# Patient Record
Sex: Female | Born: 1975 | Hispanic: Yes | Marital: Single | Smoking: Former smoker
Health system: Southern US, Community
[De-identification: ages and names within clinical notes are randomized; demographics above are authoritative.]

## PROBLEM LIST (undated history)

## (undated) DIAGNOSIS — E669 Obesity, unspecified: Secondary | ICD-10-CM

## (undated) DIAGNOSIS — A159 Respiratory tuberculosis unspecified: Secondary | ICD-10-CM

## (undated) DIAGNOSIS — O039 Complete or unspecified spontaneous abortion without complication: Secondary | ICD-10-CM

## (undated) DIAGNOSIS — IMO0002 Reserved for concepts with insufficient information to code with codable children: Secondary | ICD-10-CM

## (undated) DIAGNOSIS — R87629 Unspecified abnormal cytological findings in specimens from vagina: Secondary | ICD-10-CM

## (undated) DIAGNOSIS — IMO0001 Reserved for inherently not codable concepts without codable children: Secondary | ICD-10-CM

## (undated) DIAGNOSIS — O09529 Supervision of elderly multigravida, unspecified trimester: Secondary | ICD-10-CM

## (undated) DIAGNOSIS — Z8619 Personal history of other infectious and parasitic diseases: Secondary | ICD-10-CM

## (undated) DIAGNOSIS — R87619 Unspecified abnormal cytological findings in specimens from cervix uteri: Secondary | ICD-10-CM

## (undated) HISTORY — PX: COLPOSCOPY: SHX161

## (undated) HISTORY — PX: WISDOM TOOTH EXTRACTION: SHX21

## (undated) HISTORY — PX: DILATION AND CURETTAGE OF UTERUS: SHX78

## (undated) HISTORY — DX: Respiratory tuberculosis unspecified: A15.9

## (undated) HISTORY — DX: Obesity, unspecified: E66.9

## (undated) HISTORY — DX: Supervision of elderly multigravida, unspecified trimester: O09.529

## (undated) HISTORY — DX: Unspecified abnormal cytological findings in specimens from vagina: R87.629

## (undated) HISTORY — DX: Personal history of other infectious and parasitic diseases: Z86.19

---

## 2010-05-25 LAB — CBC
HCT: 36 % (ref 36–46)
Hemoglobin: 12 g/dL (ref 12.0–16.0)
Platelets: 247 10*3/uL (ref 150–399)

## 2010-05-25 LAB — GC/CHLAMYDIA PROBE AMP, GENITAL: Chlamydia: NEGATIVE

## 2010-05-25 LAB — HIV ANTIBODY (ROUTINE TESTING W REFLEX): HIV: NONREACTIVE

## 2010-12-18 ENCOUNTER — Inpatient Hospital Stay (HOSPITAL_COMMUNITY)
Admission: AD | Admit: 2010-12-18 | Discharge: 2010-12-18 | Disposition: A | Discharge status: Home / Self Care | Payer: Medicaid Other | Source: Ambulatory Visit | Attending: Obstetrics & Gynecology | Admitting: Obstetrics & Gynecology

## 2010-12-18 ENCOUNTER — Encounter (HOSPITAL_COMMUNITY): Payer: Self-pay | Admitting: *Deleted

## 2010-12-18 ENCOUNTER — Inpatient Hospital Stay (HOSPITAL_COMMUNITY): Payer: Medicaid Other

## 2010-12-18 DIAGNOSIS — O0930 Supervision of pregnancy with insufficient antenatal care, unspecified trimester: Secondary | ICD-10-CM | POA: Insufficient documentation

## 2010-12-18 DIAGNOSIS — N760 Acute vaginitis: Secondary | ICD-10-CM

## 2010-12-18 DIAGNOSIS — O4690 Antepartum hemorrhage, unspecified, unspecified trimester: Secondary | ICD-10-CM | POA: Insufficient documentation

## 2010-12-18 HISTORY — DX: Reserved for concepts with insufficient information to code with codable children: IMO0002

## 2010-12-18 HISTORY — DX: Unspecified abnormal cytological findings in specimens from cervix uteri: R87.619

## 2010-12-18 LAB — DIFFERENTIAL
Basophils Absolute: 0 10*3/uL (ref 0.0–0.1)
Eosinophils Relative: 1 % (ref 0–5)
Lymphocytes Relative: 22 % (ref 12–46)
Monocytes Absolute: 0.6 10*3/uL (ref 0.1–1.0)
Monocytes Relative: 5 % (ref 3–12)

## 2010-12-18 LAB — WET PREP, GENITAL
Trich, Wet Prep: NONE SEEN
Yeast Wet Prep HPF POC: NONE SEEN

## 2010-12-18 LAB — ABO/RH
ABO/RH(D): O POS
RH Type: POSITIVE

## 2010-12-18 LAB — CBC
HCT: 31.9 % — ABNORMAL LOW (ref 36.0–46.0)
MCV: 84.4 fL (ref 78.0–100.0)
RDW: 14.5 % (ref 11.5–15.5)
WBC: 11.7 10*3/uL — ABNORMAL HIGH (ref 4.0–10.5)

## 2010-12-18 LAB — HIV ANTIBODY (ROUTINE TESTING W REFLEX): HIV: NONREACTIVE

## 2010-12-18 MED ORDER — METRONIDAZOLE 500 MG PO TABS: 500.0000 mg | ORAL_TABLET | Freq: Three times a day (TID) | ORAL | Status: AC

## 2010-12-18 NOTE — Progress Notes (Signed)
Patient is here with c/o of bright small vaginal bleeding. She states that she woke and voided and noticed the bleeding. She reports good fetal movement. She has not had a dr's appt since July because she moved here from [place] and is awaiting her medicaid. She states that in July amniocentesis and ultrasound were normal. She states that she has some mild intermittent cramping.

## 2010-12-18 NOTE — ED Provider Notes (Addendum)
History     No chief complaint on file.  HPI This is a 35 year old G7 P2 042 at 35 weeks 3 days who presents the MAU with scant vaginal bleeding that happened earlier this morning. She woke up at 4:30 went to the bathroom and when she wiped she had scant blood on the tissue paper. She has not had any episodes of this during the pregnancy. She did have a scant blood and tissue paper after using the bathroom in the MAU. She currently denies fevers, chills, nausea, vomiting, abdominal pain, contractions, vision changes, abdominal pain, hemorrhoids. She does admit to thin white vaginal discharge. All of her previous deliveries have been vaginal. She's had no previous complications to her pregnancies. She recently moved down to [place] from [place].   OB History    Grav Para Term Preterm Abortions TAB SAB Ect Mult Living   7 2 2  4 3 1   2       Past Medical History  Diagnosis Date  . Abnormal Pap smear     Past Surgical History  Procedure Date  . Dilation and curettage of uterus     tab x2  . Colposcopy     No family history on file.  History  Substance Use Topics  . Smoking status: Never Smoker   . Smokeless tobacco: Not on file  . Alcohol Use: No    Allergies: No Known Allergies  Prescriptions prior to admission  Medication Sig Dispense Refill  . prenatal vitamin w/FE, FA (PRENATAL 1 + 1) 27-1 MG TABS Take 1 tablet by mouth daily.          Review of Systems  All other systems reviewed and are negative.   Physical Exam   Blood pressure 117/49, pulse 88, temperature 98.5 F (36.9 C), resp. rate 18, height 5\' 2"  (1.575 m), weight 95.766 kg (211 lb 2 oz), SpO2 98.00%.  Physical Exam  Constitutional: She is oriented to person, place, and time. She appears well-developed and well-nourished.  HENT:  Head: Normocephalic and atraumatic.  Eyes: Pupils are equal, round, and reactive to light.  Neck: Normal range of motion. Neck supple.  Cardiovascular: Normal rate,  regular rhythm and normal heart sounds.   Respiratory: Effort normal and breath sounds normal. No respiratory distress. She has no wheezes. She has no rales. She exhibits no tenderness.  GI: Soft. Bowel sounds are normal. She exhibits no distension and no mass. There is no tenderness. There is no rebound and no guarding.       Fundal height is approximately 35 cm.. Fetus feels cephalic presentation.  Genitourinary: Vagina normal.       Postoperative feels adequate for vaginal delivery.  Musculoskeletal: Normal range of motion.  Neurological: She is alert and oriented to person, place, and time.   NST shows a category 1 tracing with a baseline rate of 120. No contractions are seen on tocometry.  MAU Course  Procedures  Assessment and Plan  #27 35 year old female G7 P2 042 at 35 weeks and 3 days #2 insufficient prenatal care #3 vaginal bleeding  Vaginal cultures were obtained. Wet prep and GC/ Chlamydia were sent. We'll obtain prenatal labs. Due to insufficient to care we will also obtain a complete ultrasound specifically to look for the sent to position.  Patient signed out to Dierdre Christy Gentles, CNM.  Candelaria Celeste JEHIEL 12/18/2010, 7:55 AM  Results for orders placed during the hospital encounter of 12/18/10 (from the past 24 hour(s))  WET PREP, GENITAL  Status: Abnormal   Collection Time   12/18/10  7:50 AM      Component Value Range   Yeast, Wet Prep NONE SEEN  NONE SEEN    Trich, Wet Prep NONE SEEN  NONE SEEN    Clue Cells, Wet Prep FEW (*) NONE SEEN    WBC, Wet Prep HPF POC FEW (*) NONE SEEN   CBC     Status: Abnormal   Collection Time   12/18/10  8:10 AM      Component Value Range   WBC 11.7 (*) 4.0 - 10.5 (K/uL)   RBC 3.78 (*) 3.87 - 5.11 (MIL/uL)   Hemoglobin 10.3 (*) 12.0 - 15.0 (g/dL)   HCT 16.1 (*) 09.6 - 46.0 (%)   MCV 84.4  78.0 - 100.0 (fL)   MCH 27.2  26.0 - 34.0 (pg)   MCHC 32.3  30.0 - 36.0 (g/dL)   RDW 04.5  40.9 - 81.1 (%)   Platelets 158  150 - 400 (K/uL)    DIFFERENTIAL     Status: Abnormal   Collection Time   12/18/10  8:10 AM      Component Value Range   Neutrophils Relative 72  43 - 77 (%)   Neutro Abs 8.4 (*) 1.7 - 7.7 (K/uL)   Lymphocytes Relative 22  12 - 46 (%)   Lymphs Abs 2.5  0.7 - 4.0 (K/uL)   Monocytes Relative 5  3 - 12 (%)   Monocytes Absolute 0.6  0.1 - 1.0 (K/uL)   Eosinophils Relative 1  0 - 5 (%)   Eosinophils Absolute 0.1  0.0 - 0.7 (K/uL)   Basophils Relative 0  0 - 1 (%)   Basophils Absolute 0.0  0.0 - 0.1 (K/uL)  TYPE AND SCREEN     Status: Normal   Collection Time   12/18/10  8:10 AM      Component Value Range   ABO/RH(D) O POS     Antibody Screen NEG     Sample Expiration 12/21/2010    ABO/RH     Status: Normal   Collection Time   12/18/10  8:10 AM      Component Value Range   ABO/RH(D) O POS    GLUCOSE, CAPILLARY     Status: Normal   Collection Time   12/18/10 10:34 AM      Component Value Range   Glucose-Capillary 75  70 - 99 (mg/dL)   Addendum: 1. Only has amnio records and no response yet from  ROI records. Will F/U in [facility] clinic in 6 days. 1. ? Glucose intolerance  States she had early glucola in June due to ?GDM with last pregnancy. Unsure of June result but was told to limit carbs. Will come tomorrow for 1 hr glucola if random CBG is normal.  2. Still reports a little blood on tissue with wiping.[place] today all normal with good growth, nl placenta.  Has few clues on WP so will tx for BV and also check urine C&S. Bleeding precautions.    ;

## 2010-12-18 NOTE — Progress Notes (Signed)
PT SAY TONIGHT  WENT TO B-ROOM-  WHEN WIPED SAW BLOOD  -  - BRIGHT RED.  NO MEDICAL PROBLEMS.  HAS PAD ON IN TRIAGE- NOTHING ON IT.    NO SEX TONIGHT.  PT HAS MOVED FROM [place]- TO DELIVER BABY.    WAS GETTING PNC AT [facility] WITH DR  Elonda Husky .Marland Kitchen LAST SEEN  AT CLINIC WAS 09-04-2010- TOLD EVERYTHING OK.    SAYS HAS PAIN IN R LEG - DOWN  BUTTOCKS.  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  DENIES HSV AND MRSA.

## 2010-12-19 ENCOUNTER — Other Ambulatory Visit: Payer: Self-pay

## 2010-12-19 DIAGNOSIS — Z3490 Encounter for supervision of normal pregnancy, unspecified, unspecified trimester: Secondary | ICD-10-CM

## 2010-12-23 NOTE — ED Provider Notes (Signed)
Agree with above note.  Rhonda Jackson 12/23/2010 11:43 AM

## 2011-01-01 ENCOUNTER — Ambulatory Visit (INDEPENDENT_AMBULATORY_CARE_PROVIDER_SITE_OTHER): Payer: Self-pay | Admitting: Family Medicine

## 2011-01-01 ENCOUNTER — Other Ambulatory Visit: Payer: Self-pay | Admitting: Advanced Practice Midwife

## 2011-01-01 VITALS — BP 109/74 | Temp 97.1°F | Wt 216.4 lb

## 2011-01-01 DIAGNOSIS — O09529 Supervision of elderly multigravida, unspecified trimester: Secondary | ICD-10-CM | POA: Insufficient documentation

## 2011-01-01 DIAGNOSIS — E669 Obesity, unspecified: Secondary | ICD-10-CM | POA: Insufficient documentation

## 2011-01-01 DIAGNOSIS — IMO0002 Reserved for concepts with insufficient information to code with codable children: Secondary | ICD-10-CM

## 2011-01-01 DIAGNOSIS — Z3480 Encounter for supervision of other normal pregnancy, unspecified trimester: Secondary | ICD-10-CM

## 2011-01-01 DIAGNOSIS — O0990 Supervision of high risk pregnancy, unspecified, unspecified trimester: Secondary | ICD-10-CM | POA: Insufficient documentation

## 2011-01-01 LAB — POCT URINALYSIS DIP (DEVICE)
Nitrite: NEGATIVE
Urobilinogen, UA: 0.2 mg/dL (ref 0.0–1.0)
pH: 6.5 (ref 5.0–8.0)

## 2011-01-01 NOTE — Progress Notes (Signed)
Good fetal activity.  No contractions, vaginal discharge, vaginal bleeding.  Has some round ligament pain.  GC/chlamydia done.  GBS done.  Prenatal records from [place] reviewed.  20 week [place] normal.  No other concern.  Follow up in 1 week.

## 2011-01-01 NOTE — Progress Notes (Signed)
Edema-fingers. Pain-right side, Pressure-lower pelvic,vaginal.  White vaginal discharge. Pulse- 98.  Needs GBS Pt would like for baby to not have vaccinations Pt declines flu vaccine and TDap vaccine

## 2011-01-01 NOTE — Patient Instructions (Signed)

## 2011-01-02 LAB — GC/CHLAMYDIA PROBE AMP, GENITAL: GC Probe Amp, Genital: NEGATIVE

## 2011-01-04 LAB — STREP B DNA PROBE: GBS: NEGATIVE

## 2011-01-07 ENCOUNTER — Telehealth (HOSPITAL_COMMUNITY): Payer: Self-pay | Admitting: *Deleted

## 2011-01-07 ENCOUNTER — Encounter (HOSPITAL_COMMUNITY): Payer: Self-pay | Admitting: *Deleted

## 2011-01-07 NOTE — Telephone Encounter (Signed)
Preadmission screen  

## 2011-01-08 ENCOUNTER — Ambulatory Visit (INDEPENDENT_AMBULATORY_CARE_PROVIDER_SITE_OTHER): Payer: Self-pay | Admitting: Advanced Practice Midwife

## 2011-01-08 ENCOUNTER — Other Ambulatory Visit: Payer: Self-pay

## 2011-01-08 VITALS — BP 129/76 | HR 81 | Temp 97.9°F | Wt 220.5 lb

## 2011-01-08 DIAGNOSIS — O09529 Supervision of elderly multigravida, unspecified trimester: Secondary | ICD-10-CM

## 2011-01-08 DIAGNOSIS — Z3480 Encounter for supervision of other normal pregnancy, unspecified trimester: Secondary | ICD-10-CM

## 2011-01-08 LAB — COMPREHENSIVE METABOLIC PANEL
ALT: <8 U/L (ref 0–35)
AST: 11 U/L (ref 0–37)
CO2: 24 mEq/L (ref 19–32)
Chloride: 108 mEq/L (ref 96–112)
Sodium: 139 mEq/L (ref 135–145)
Total Bilirubin: 0.2 mg/dL — ABNORMAL LOW (ref 0.3–1.2)
Total Protein: 6.1 g/dL (ref 6.0–8.3)

## 2011-01-08 LAB — CBC
MCH: 26.8 pg (ref 26.0–34.0)
Platelets: 172 10*3/uL (ref 150–400)
RBC: 3.84 MIL/uL — ABNORMAL LOW (ref 3.87–5.11)
RDW: 15.6 % — ABNORMAL HIGH (ref 11.5–15.5)
WBC: 9.9 10*3/uL (ref 4.0–10.5)

## 2011-01-08 NOTE — Patient Instructions (Signed)
Pregnancy - Third Trimester The third trimester of pregnancy (the last 3 months) is a period of the most rapid growth for you and your baby. The baby approaches a length of 20 inches and a weight of 6 to 10 pounds. The baby is adding on fat and getting ready for life outside your body. While inside, babies have periods of sleeping and waking, suck their thumbs, and hiccups. You can often feel small contractions of the uterus. This is false labor. It is also called Braxton-Hicks contractions. This is like a practice for labor. The usual problems in this stage of pregnancy include more difficulty breathing, swelling of the hands and feet from water retention, and having to urinate more often because of the uterus and baby pressing on your bladder.  PRENATAL EXAMS  Blood work may continue to be done during prenatal exams. These tests are done to check on your health and the probable health of your baby. Blood work is used to follow your blood levels (hemoglobin). Anemia (low hemoglobin) is common during pregnancy. Iron and vitamins are given to help prevent this. You may also continue to be checked for diabetes. Some of the past blood tests may be done again.   The size of the uterus is measured during each visit. This makes sure your baby is growing properly according to your pregnancy dates.   Your blood pressure is checked every prenatal visit. This is to make sure you are not getting toxemia.   Your urine is checked every prenatal visit for infection, diabetes and protein.   Your weight is checked at each visit. This is done to make sure gains are happening at the suggested rate and that you and your baby are growing normally.   Sometimes, an ultrasound is performed to confirm the position and the proper growth and development of the baby. This is a test done that bounces harmless sound waves off the baby so your caregiver can more accurately determine due dates.   Discuss the type of pain  medication and anesthesia you will have during your labor and delivery.   Discuss the possibility and anesthesia if a Cesarean Section might be necessary.   Inform your caregiver if there is any mental or physical violence at home.  Sometimes, a specialized non-stress test, contraction stress test and biophysical profile are done to make sure the baby is not having a problem. Checking the amniotic fluid surrounding the baby is called an amniocentesis. The amniotic fluid is removed by sticking a needle into the belly (abdomen). This is sometimes done near the end of pregnancy if an early delivery is required. In this case, it is done to help make sure the baby's lungs are mature enough for the baby to live outside of the womb. If the lungs are not mature and it is unsafe to deliver the baby, an injection of cortisone medication is given to the mother 1 to 2 days before the delivery. This helps the baby's lungs mature and makes it safer to deliver the baby. CHANGES OCCURING IN THE THIRD TRIMESTER OF PREGNANCY Your body goes through many changes during pregnancy. They vary from person to person. Talk to your caregiver about changes you notice and are concerned about.  During the last trimester, you have probably had an increase in your appetite. It is normal to have cravings for certain foods. This varies from person to person and pregnancy to pregnancy.   You may begin to get stretch marks on your hips,   abdomen, and breasts. These are normal changes in the body during pregnancy. There are no exercises or medications to take which prevent this change.   Constipation may be treated with a stool softener or adding bulk to your diet. Drinking lots of fluids, fiber in vegetables, fruits, and whole grains are helpful.   Exercising is also helpful. If you have been very active up until your pregnancy, most of these activities can be continued during your pregnancy. If you have been less active, it is helpful  to start an exercise program such as walking. Consult your caregiver before starting exercise programs.   Avoid all smoking, alcohol, un-prescribed drugs, herbs and "street drugs" during your pregnancy. These chemicals affect the formation and growth of the baby. Avoid chemicals throughout the pregnancy to ensure the delivery of a healthy infant.   Backache, varicose veins and hemorrhoids may develop or get worse.   You will tire more easily in the third trimester, which is normal.   The baby's movements may be stronger and more often.   You may become short of breath easily.   Your belly button may stick out.   A yellow discharge may leak from your breasts called colostrum.   You may have a bloody mucus discharge. This usually occurs a few days to a week before labor begins.  HOME CARE INSTRUCTIONS   Keep your caregiver's appointments. Follow your caregiver's instructions regarding medication use, exercise, and diet.   During pregnancy, you are providing food for you and your baby. Continue to eat regular, well-balanced meals. Choose foods such as meat, fish, milk and other low fat dairy products, vegetables, fruits, and whole-grain breads and cereals. Your caregiver will tell you of the ideal weight gain.   A physical sexual relationship may be continued throughout pregnancy if there are no other problems such as early (premature) leaking of amniotic fluid from the membranes, vaginal bleeding, or belly (abdominal) pain.   Exercise regularly if there are no restrictions. Check with your caregiver if you are unsure of the safety of your exercises. Greater weight gain will occur in the last 2 trimesters of pregnancy. Exercising helps:   Control your weight.   Get you in shape for labor and delivery.   You lose weight after you deliver.   Rest a lot with legs elevated, or as needed for leg cramps or low back pain.   Wear a good support or jogging bra for breast tenderness during  pregnancy. This may help if worn during sleep. Pads or tissues may be used in the bra if you are leaking colostrum.   Do not use hot tubs, steam rooms, or saunas.   Wear your seat belt when driving. This protects you and your baby if you are in an accident.   Avoid raw meat, cat litter boxes and soil used by cats. These carry germs that can cause birth defects in the baby.   It is easier to loose urine during pregnancy. Tightening up and strengthening the pelvic muscles will help with this problem. You can practice stopping your urination while you are going to the bathroom. These are the same muscles you need to strengthen. It is also the muscles you would use if you were trying to stop from passing gas. You can practice tightening these muscles up 10 times a set and repeating this about 3 times per day. Once you know what muscles to tighten up, do not perform these exercises during urination. It is more likely   to cause an infection by backing up the urine.   Ask for help if you have financial, counseling or nutritional needs during pregnancy. Your caregiver will be able to offer counseling for these needs as well as refer you for other special needs.   Make a list of emergency phone numbers and have them available.   Plan on getting help from family or friends when you go home from the hospital.   Make a trial run to the hospital.   Take prenatal classes with the father to understand, practice and ask questions about the labor and delivery.   Prepare the baby's room/nursery.   Do not travel out of the city unless it is absolutely necessary and with the advice of your caregiver.   Wear only low or no heal shoes to have better balance and prevent falling.  MEDICATIONS AND DRUG USE IN PREGNANCY  Take prenatal vitamins as directed. The vitamin should contain 1 milligram of folic acid. Keep all vitamins out of reach of children. Only a couple vitamins or tablets containing iron may be fatal  to a baby or young child when ingested.   Avoid use of all medications, including herbs, over-the-counter medications, not prescribed or suggested by your caregiver. Only take over-the-counter or prescription medicines for pain, discomfort, or fever as directed by your caregiver. Do not use aspirin, ibuprofen (Motrin, Advil, Nuprin) or naproxen (Aleve) unless OK'd by your caregiver.   Let your caregiver also know about herbs you may be using.   Alcohol is related to a number of birth defects. This includes fetal alcohol syndrome. All alcohol, in any form, should be avoided completely. Smoking will cause low birth rate and premature babies.   Street/illegal drugs are very harmful to the baby. They are absolutely forbidden. A baby born to an addicted mother will be addicted at birth. The baby will go through the same withdrawal an adult does.  SEEK MEDICAL CARE IF: You have any concerns or worries during your pregnancy. It is better to call with your questions if you feel they cannot wait, rather than worry about them. DECISIONS ABOUT CIRCUMCISION You may or may not know the sex of your baby. If you know your baby is a boy, it may be time to think about circumcision. Circumcision is the removal of the foreskin of the penis. This is the skin that covers the sensitive end of the penis. There is no proven medical need for this. Often this decision is made on what is popular at the time or based upon religious beliefs and social issues. You can discuss these issues with your caregiver or pediatrician. SEEK IMMEDIATE MEDICAL CARE IF:   An unexplained oral temperature above 102 F (38.9 C) develops, or as your caregiver suggests.   You have leaking of fluid from the vagina (birth canal). If leaking membranes are suspected, take your temperature and tell your caregiver of this when you call.   There is vaginal spotting, bleeding or passing clots. Tell your caregiver of the amount and how many pads are  used.   You develop a bad smelling vaginal discharge with a change in the color from clear to white.   You develop vomiting that lasts more than 24 hours.   You develop chills or fever.   You develop shortness of breath.   You develop burning on urination.   You loose more than 2 pounds of weight or gain more than 2 pounds of weight or as suggested by your   caregiver.   You notice sudden swelling of your face, hands, and feet or legs.   You develop belly (abdominal) pain. Round ligament discomfort is a common non-cancerous (benign) cause of abdominal pain in pregnancy. Your caregiver still must evaluate you.   You develop a severe headache that does not go away.   You develop visual problems, blurred or double vision.   If you have not felt your baby move for more than 1 hour. If you think the baby is not moving as much as usual, eat something with sugar in it and lie down on your left side for an hour. The baby should move at least 4 to 5 times per hour. Call right away if your baby moves less than that.   You fall, are in a car accident or any kind of trauma.   There is mental or physical violence at home.  Document Released: 02/04/2001 Document Revised: 10/23/2010 Document Reviewed: 08/09/2008 [employer] Patient Information 2012 [employer]. Breastfeeding BENEFITS OF BREASTFEEDING For the baby  The first milk (colostrum) helps the baby's digestive system function better.   There are antibodies from the mother in the milk that help the baby fight off infections.   The baby has a lower incidence of asthma, allergies, and SIDS (sudden infant death syndrome).   The nutrients in breast milk are better than formulas for the baby and helps the baby's brain grow better.   Babies who breastfeed have less gas, colic, and constipation.  For the mother  Breastfeeding helps develop a very special bond between mother and baby.   It is more convenient, always available at the  correct temperature and cheaper than formula feeding.   It burns calories in the mother and helps with losing weight that was gained during pregnancy.   It makes the uterus contract back down to normal size faster and slows bleeding following delivery.   Breastfeeding mothers have a lower risk of developing breast cancer.  NURSE FREQUENTLY  A healthy, full-term baby may breastfeed as often as every hour or space his or her feedings to every 3 hours.   How often to nurse will vary from baby to baby. Watch your baby for signs of hunger, not the clock.   Nurse as often as the baby requests, or when you feel the need to reduce the fullness of your breasts.   Awaken the baby if it has been 3 to 4 hours since the last feeding.   Frequent feeding will help the mother make more milk and will prevent problems like sore nipples and engorgement of the breasts.  BABY'S POSITION AT THE BREAST  Whether lying down or sitting, be sure that the baby's tummy is facing your tummy.   Support the breast with 4 fingers underneath the breast and the thumb above. Make sure your fingers are well away from the nipple and baby's mouth.   Stroke the baby's lips and cheek closest to the breast gently with your finger or nipple.   When the baby's mouth is open wide enough, place all of your nipple and as much of the dark area around the nipple as possible into your baby's mouth.   Pull the baby in close so the tip of the nose and the baby's cheeks touch the breast during the feeding.  FEEDINGS  The length of each feeding varies from baby to baby and from feeding to feeding.   The baby must suck about 2 to 3 minutes for your milk   to get to him or her. This is called a "let down." For this reason, allow the baby to feed on each breast as long as he or she wants. Your baby will end the feeding when he or she has received the right balance of nutrients.   To break the suction, put your finger into the corner of the  baby's mouth and slide it between his or her gums before removing your breast from his or her mouth. This will help prevent sore nipples.  REDUCING BREAST ENGORGEMENT  In the first week after your baby is born, you may experience signs of breast engorgement. When breasts are engorged, they feel heavy, warm, full, and may be tender to the touch. You can reduce engorgement if you:   Nurse frequently, every 2 to 3 hours. Mothers who breastfeed early and often have fewer problems with engorgement.   Place light ice packs on your breasts between feedings. This reduces swelling. Wrap the ice packs in a lightweight towel to protect your skin.   Apply moist hot packs to your breast for 5 to 10 minutes before each feeding. This increases circulation and helps the milk flow.   Gently massage your breast before and during the feeding.   Make sure that the baby empties at least one breast at every feeding before switching sides.   Use a breast pump to empty the breasts if your baby is sleepy or not nursing well. You may also want to pump if you are returning to work or or you feel you are getting engorged.   Avoid bottle feeds, pacifiers or supplemental feedings of water or juice in place of breastfeeding.   Be sure the baby is latched on and positioned properly while breastfeeding.   Prevent fatigue, stress, and anemia.   Wear a supportive bra, avoiding underwire styles.   Eat a balanced diet with enough fluids.  If you follow these suggestions, your engorgement should improve in 24 to 48 hours. If you are still experiencing difficulty, call your lactation consultant or caregiver. IS MY BABY GETTING ENOUGH MILK? Sometimes, mothers worry about whether their babies are getting enough milk. You can be assured that your baby is getting enough milk if:  The baby is actively sucking and you hear swallowing.   The baby nurses at least 8 to 12 times in a 24 hour time period. Nurse your baby until he or  she unlatches or falls asleep at the first breast (at least 10 to 20 minutes), then offer the second side.   The baby is wetting 5 to 6 disposable diapers (6 to 8 cloth diapers) in a 24 hour period by 5 to 6 days of age.   The baby is having at least 2 to 3 stools every 24 hours for the first few months. Breast milk is all the food your baby needs. It is not necessary for your baby to have water or formula. In fact, to help your breasts make more milk, it is best not to give your baby supplemental feedings during the early weeks.   The stool should be soft and yellow.   The baby should gain 4 to 7 ounces per week after he is 4 days old.  TAKE CARE OF YOURSELF Take care of your breasts by:  Bathing or showering daily.   Avoiding the use of soaps on your nipples.   Start feedings on your left breast at one feeding and on your right breast at the next feeding.     You will notice an increase in your milk supply 2 to 5 days after delivery. You may feel some discomfort from engorgement, which makes your breasts very firm and often tender. Engorgement "peaks" out within 24 to 48 hours. In the meantime, apply warm moist towels to your breasts for 5 to 10 minutes before feeding. Gentle massage and expression of some milk before feeding will soften your breasts, making it easier for your baby to latch on. Wear a well fitting nursing bra and air dry your nipples for 10 to 15 minutes after each feeding.   Only use cotton bra pads.   Only use pure lanolin on your nipples after nursing. You do not need to wash it off before nursing.  Take care of yourself by:   Eating well-balanced meals and nutritious snacks.   Drinking milk, fruit juice, and water to satisfy your thirst (about 8 glasses a day).   Getting plenty of rest.   Increasing calcium in your diet (1200 mg a day).   Avoiding foods that you notice affect the baby in a bad way.  SEEK MEDICAL CARE IF:   You have any questions or difficulty  with breastfeeding.   You need help.   You have a hard, red, sore area on your breast, accompanied by a fever of 100.5 F (38.1 C) or more.   Your baby is too sleepy to eat well or is having trouble sleeping.   Your baby is wetting less than 6 diapers per day, by 5 days of age.   Your baby's skin or white part of his or her eyes is more yellow than it was in the hospital.   You feel depressed.  Document Released: 02/10/2005 Document Revised: 10/23/2010 Document Reviewed: 09/25/2008 [employer] Patient Information 2012 [employer]. 

## 2011-01-08 NOTE — Progress Notes (Signed)
C/O lightheadedness, seeing spots, having headaches which resolve with tylenol. BP normal today, no protein on UA. Eating lots of fruit and carbs. Recommended eating more frequently, increase protein, increase hydration. CBC and CMP ordered today. Precautions rev'd.

## 2011-01-08 NOTE — Progress Notes (Signed)
Pain and pressure: pelvic  Vaginal D/C: brownish in color, no odor, no itch Flu Vaccine: Declined Tdap: Declined

## 2011-01-15 ENCOUNTER — Ambulatory Visit (INDEPENDENT_AMBULATORY_CARE_PROVIDER_SITE_OTHER): Payer: Self-pay | Admitting: Obstetrics and Gynecology

## 2011-01-15 ENCOUNTER — Other Ambulatory Visit: Payer: Self-pay

## 2011-01-15 ENCOUNTER — Encounter: Payer: Self-pay | Admitting: Obstetrics and Gynecology

## 2011-01-15 DIAGNOSIS — O09529 Supervision of elderly multigravida, unspecified trimester: Secondary | ICD-10-CM

## 2011-01-15 NOTE — Progress Notes (Signed)
Subjective:    Rhonda Jackson is a 35 y.o. [id number] [redacted]w[redacted]d being seen today for her obstetrical visit.  Patient reports no contractions, no cramping and no leaking. Fetal movement: normal. Patient reports since last visit, dizziness and lightheadedness has improved with increase intake of protein. Patient is a vegetarian.   Objective:    BP 124/84   Temp 98.2 F (36.8 C)   Wt 219 lb 11.2 oz (99.655 kg)  Physical Exam  Exam Heart: regular rate and rhythm. No rubs, murmurs, gallops. Lungs: clear to auscultation bilaterally Abdomen: nontender Cervix: 1.5cm, long, soft  FHT:  120 BPM  Uterine Size: 39 cm        Assessment:  Rhonda Jackson is a 35 y.o. [id number] [redacted]w[redacted]d being seen today for her obstetrical visit.  Pregnancy:  [id number]    Plan:    Patient's pregnancy is progressing as expected. Patient is due in 3 days. Feels some pressure but has not had contractions. Patient is to return to clinic in 1 week or sooner if needed.  Patient Active Problem List  Diagnoses   Advanced maternal age in pregnancy   Supervision of other normal pregnancy   Obesity    Follow up in 1 Week.

## 2011-01-15 NOTE — Progress Notes (Signed)
P=97, c/o increased pelvic pressure when walking or moving, c/o not leaking breastmilk like she did with last 2 pregnancy,

## 2011-01-15 NOTE — Patient Instructions (Signed)
Normal Labor and Delivery Your caregiver must first be sure you are in labor. Signs of labor include:  You may pass what is called "the mucus plug" before labor begins. This is a small amount of blood stained mucus.   Regular uterine contractions.   The time between contractions get closer together.   The discomfort and pain gradually gets more intense.   Pains are mostly located in the back.   Pains get worse when walking.   The cervix (the opening of the uterus becomes thinner (begins to efface) and opens up (dilates).  Once you are in labor and admitted into the hospital or care center, your caregiver will do the following:  A complete physical examination.   Check your vital signs (blood pressure, pulse, temperature and the fetal heart rate).   Do a vaginal examination (using a sterile glove and lubricant) to determine:   The position (presentation) of the baby (head [vertex] or buttock first).   The level (station) of the baby's head in the birth canal.   The effacement and dilatation of the cervix.   You may have your pubic hair shaved and be given an enema depending on your caregiver and the circumstance.   An electronic monitor is usually placed on your abdomen. The monitor follows the length and intensity of the contractions, as well as the baby's heart rate.   Usually, your caregiver will insert an IV in your arm with a bottle of sugar water. This is done as a precaution so that medications can be given to you quickly during labor or delivery.  NORMAL LABOR AND DELIVERY IS DIVIDED UP INTO 3 STAGES: First Stage This is when regular contractions begin and the cervix begins to efface and dilate. This stage can last from 3 to 15 hours. The end of the first stage is when the cervix is 100% effaced and 10 centimeters dilated. Pain medications may be given by   Injection (morphine, demerol, etc.)   Regional anesthesia (spinal, caudal or epidural, anesthetics given in  different locations of the spine). Paracervical pain medication may be given, which is an injection of and anesthetic on each side of the cervix.  A pregnant woman may request to have "Natural Childbirth" which is not to have any medications or anesthesia during her labor and delivery. Second Stage This is when the baby comes down through the birth canal (vagina) and is born. This can take 1 to 4 hours. As the baby's head comes down through the birth canal, you may feel like you are going to have a bowel movement. You will get the urge to bear down and push until the baby is delivered. As the baby's head is being delivered, the caregiver will decide if an episiotomy (a cut in the perineum and vagina area) is needed to prevent tearing of the tissue in this area. The episiotomy is sewn up after the delivery of the baby and placenta. Sometimes a mask with nitrous oxide is given for the mother to breath during the delivery of the baby to help if there is too much pain. The end of Stage 2 is when the baby is fully delivered. Then when the umbilical cord stops pulsating it is clamped and cut. Third Stage The third stage begins after the baby is completely delivered and ends after the placenta (afterbirth) is delivered. This usually takes 5 to 30 minutes. After the placenta is delivered, a medication is given either by intravenous or injection to help contract   the uterus and prevent bleeding. The third stage is not painful and pain medication is usually not necessary. If an episiotomy was done, it is repaired at this time. After the delivery, the mother is watched and monitored closely for 1 to 2 hours to make sure there is no postpartum bleeding (hemorrhage). If there is a lot of bleeding, medication is given to contract the uterus and stop the bleeding. Document Released: 11/20/2007 Document Revised: 10/23/2010 Document Reviewed: 11/20/2007 [employer] Patient Information 2012 [employer]. Breastfeeding BENEFITS OF BREASTFEEDING For the baby  The first milk (colostrum) helps the baby's digestive system function better.   There are antibodies from the mother in the milk that help the baby fight off infections.   The baby has a lower incidence of asthma, allergies, and SIDS (sudden infant death syndrome).   The nutrients in breast milk are better than formulas for the baby and helps the baby's brain grow better.   Babies who breastfeed have less gas, colic, and constipation.  For the mother  Breastfeeding helps develop a very special bond between mother and baby.   It is more convenient, always available at the correct temperature and cheaper than formula feeding.   It burns calories in the mother and helps with losing weight that was gained during pregnancy.   It makes the uterus contract back down to normal size faster and slows bleeding following delivery.   Breastfeeding mothers have a lower risk of developing breast cancer.  NURSE FREQUENTLY  A healthy, full-term baby may breastfeed as often as every hour or space his or her feedings to every 3 hours.   How often to nurse will vary from baby to baby. Watch your baby for signs of hunger, not the clock.   Nurse as often as the baby requests, or when you feel the need to reduce the fullness of your breasts.   Awaken the baby if it has been 3 to 4 hours since the last feeding.   Frequent feeding will help the mother make more milk and will prevent problems like sore nipples and engorgement of the breasts.  BABY'S POSITION AT THE BREAST  Whether lying down or sitting, be sure that the baby's tummy is facing your tummy.   Support the breast with 4 fingers underneath the breast and the thumb above. Make sure your fingers are well away from the nipple and baby's mouth.   Stroke the baby's lips and cheek closest to the breast gently with your finger or nipple.   When the baby's mouth is open wide enough,  place all of your nipple and as much of the dark area around the nipple as possible into your baby's mouth.   Pull the baby in close so the tip of the nose and the baby's cheeks touch the breast during the feeding.  FEEDINGS  The length of each feeding varies from baby to baby and from feeding to feeding.   The baby must suck about 2 to 3 minutes for your milk to get to him or her. This is called a "let down." For this reason, allow the baby to feed on each breast as long as he or she wants. Your baby will end the feeding when he or she has received the right balance of nutrients.   To break the suction, put your finger into the corner of the baby's mouth and slide it between his or her gums before removing your breast from his or her mouth. This will   help prevent sore nipples.  REDUCING BREAST ENGORGEMENT  In the first week after your baby is born, you may experience signs of breast engorgement. When breasts are engorged, they feel heavy, warm, full, and may be tender to the touch. You can reduce engorgement if you:   Nurse frequently, every 2 to 3 hours. Mothers who breastfeed early and often have fewer problems with engorgement.   Place light ice packs on your breasts between feedings. This reduces swelling. Wrap the ice packs in a lightweight towel to protect your skin.   Apply moist hot packs to your breast for 5 to 10 minutes before each feeding. This increases circulation and helps the milk flow.   Gently massage your breast before and during the feeding.   Make sure that the baby empties at least one breast at every feeding before switching sides.   Use a breast pump to empty the breasts if your baby is sleepy or not nursing well. You may also want to pump if you are returning to work or or you feel you are getting engorged.   Avoid bottle feeds, pacifiers or supplemental feedings of water or juice in place of breastfeeding.   Be sure the baby is latched on and positioned properly  while breastfeeding.   Prevent fatigue, stress, and anemia.   Wear a supportive bra, avoiding underwire styles.   Eat a balanced diet with enough fluids.  If you follow these suggestions, your engorgement should improve in 24 to 48 hours. If you are still experiencing difficulty, call your lactation consultant or caregiver. IS MY BABY GETTING ENOUGH MILK? Sometimes, mothers worry about whether their babies are getting enough milk. You can be assured that your baby is getting enough milk if:  The baby is actively sucking and you hear swallowing.   The baby nurses at least 8 to 12 times in a 24 hour time period. Nurse your baby until he or she unlatches or falls asleep at the first breast (at least 10 to 20 minutes), then offer the second side.   The baby is wetting 5 to 6 disposable diapers (6 to 8 cloth diapers) in a 24 hour period by 5 to 6 days of age.   The baby is having at least 2 to 3 stools every 24 hours for the first few months. Breast milk is all the food your baby needs. It is not necessary for your baby to have water or formula. In fact, to help your breasts make more milk, it is best not to give your baby supplemental feedings during the early weeks.   The stool should be soft and yellow.   The baby should gain 4 to 7 ounces per week after he is 4 days old.  TAKE CARE OF YOURSELF Take care of your breasts by:  Bathing or showering daily.   Avoiding the use of soaps on your nipples.   Start feedings on your left breast at one feeding and on your right breast at the next feeding.   You will notice an increase in your milk supply 2 to 5 days after delivery. You may feel some discomfort from engorgement, which makes your breasts very firm and often tender. Engorgement "peaks" out within 24 to 48 hours. In the meantime, apply warm moist towels to your breasts for 5 to 10 minutes before feeding. Gentle massage and expression of some milk before feeding will soften your breasts,  making it easier for your baby to latch on. Wear a   well fitting nursing bra and air dry your nipples for 10 to 15 minutes after each feeding.   Only use cotton bra pads.   Only use pure lanolin on your nipples after nursing. You do not need to wash it off before nursing.  Take care of yourself by:   Eating well-balanced meals and nutritious snacks.   Drinking milk, fruit juice, and water to satisfy your thirst (about 8 glasses a day).   Getting plenty of rest.   Increasing calcium in your diet (1200 mg a day).   Avoiding foods that you notice affect the baby in a bad way.  SEEK MEDICAL CARE IF:   You have any questions or difficulty with breastfeeding.   You need help.   You have a hard, red, sore area on your breast, accompanied by a fever of 100.5 F (38.1 C) or more.   Your baby is too sleepy to eat well or is having trouble sleeping.   Your baby is wetting less than 6 diapers per day, by 5 days of age.   Your baby's skin or white part of his or her eyes is more yellow than it was in the hospital.   You feel depressed.  Document Released: 02/10/2005 Document Revised: 10/23/2010 Document Reviewed: 09/25/2008 [employer] Patient Information 2012 [employer]. 

## 2011-01-22 ENCOUNTER — Telehealth (HOSPITAL_COMMUNITY): Payer: Self-pay | Admitting: *Deleted

## 2011-01-22 ENCOUNTER — Other Ambulatory Visit: Payer: Self-pay

## 2011-01-22 ENCOUNTER — Encounter (HOSPITAL_COMMUNITY): Payer: Self-pay | Admitting: *Deleted

## 2011-01-22 NOTE — Telephone Encounter (Signed)
Preadmission screen  

## 2011-01-23 ENCOUNTER — Ambulatory Visit (INDEPENDENT_AMBULATORY_CARE_PROVIDER_SITE_OTHER): Payer: Self-pay | Admitting: *Deleted

## 2011-01-23 VITALS — BP 136/66

## 2011-01-23 DIAGNOSIS — O480 Post-term pregnancy: Secondary | ICD-10-CM

## 2011-01-23 NOTE — Progress Notes (Signed)
P= 86   NST/AFI today   Pt would not like to be induced until closer to 42 wks.   IOL scheduled for 02/01/11 @ 0730

## 2011-01-25 ENCOUNTER — Inpatient Hospital Stay (HOSPITAL_COMMUNITY)
Admission: AD | Admit: 2011-01-25 | Discharge: 2011-01-25 | Disposition: A | Discharge status: Home / Self Care | Payer: Medicaid Other | Source: Ambulatory Visit | Attending: Obstetrics and Gynecology | Admitting: Obstetrics and Gynecology

## 2011-01-25 ENCOUNTER — Encounter (HOSPITAL_COMMUNITY): Payer: Self-pay | Admitting: *Deleted

## 2011-01-25 DIAGNOSIS — O209 Hemorrhage in early pregnancy, unspecified: Secondary | ICD-10-CM

## 2011-01-25 DIAGNOSIS — Z3480 Encounter for supervision of other normal pregnancy, unspecified trimester: Secondary | ICD-10-CM

## 2011-01-25 DIAGNOSIS — O4690 Antepartum hemorrhage, unspecified, unspecified trimester: Secondary | ICD-10-CM | POA: Insufficient documentation

## 2011-01-25 NOTE — ED Provider Notes (Signed)
History     Chief Complaint  Patient presents with  . Vaginal Bleeding   HPI  Patient presents with one day history of vaginal bleeding.  First noticed a dark red tinged mucous in her underwear yesterday.  Patient thought it was a mucus plug.  This morning, she noticed bright red blood clots and whenever she urinated, the toilet water turned bright red.  Patient and father came to [facility] for evaluation.  She is having irregular ctx every 4-6 minutes, and mild to moderate pelvic pressure when she stands.   Otherwise, she has no complaints.  Denies fever, chills, sweats, abnormal discharge, ROM.  OB History    Grav Para Term Preterm Abortions TAB SAB Ect Mult Living   7 2 2  4 3  0 1  2      Past Medical History  Diagnosis Date  . Abnormal Pap smear   . Tuberculosis     hx +PPD  . History of chlamydia   . Obese   . AMA (advanced maternal age) multigravida 35+     Past Surgical History  Procedure Date  . Dilation and curettage of uterus     tab x2  . Colposcopy   . Wisdom tooth extraction     Family History  Problem Relation Age of Onset  . Cancer Mother     breast cancer  . Osteoporosis Mother   . Gout Father   . Sleep apnea Father   . Emphysema Father   . Anesthesia problems Neg Hx   . Hypotension Neg Hx   . Malignant hyperthermia Neg Hx   . Pseudochol deficiency Neg Hx     History  Substance Use Topics  . Smoking status: Never Smoker   . Smokeless tobacco: Never Used  . Alcohol Use: No    Allergies: No Known Allergies  Prescriptions prior to admission  Medication Sig Dispense Refill  . acetaminophen (TYLENOL) 500 MG tablet Take 1,000 mg by mouth every 6 (six) hours as needed. Head aches       . prenatal vitamin w/FE, FA (PRENATAL 1 + 1) 27-1 MG TABS Take 1 tablet by mouth daily.          ROS  Per HPI  Physical Exam   Blood pressure 142/70, pulse 120.  Physical Exam  Constitutional: No distress.  GI: Soft. She exhibits no distension. There is no  tenderness. There is no rebound.       Gravid  Genitourinary:       Cervical exam: 1/thick/high, + blood-tinged vaginal discharge/mucus  Musculoskeletal: She exhibits edema.  Skin: Skin is warm. No rash noted. No erythema.    MAU Course  Procedures NST  Assessment and Plan  Patient is not currently in active labor. Labor check revealed cervix 1/thick/-3. Bloody mucus likely secondary to bloody show. Prodromal contractions present, FHR reassuring. Plan to D/C home today. IOL scheduled 02/01/11. Labor precautions and bleeding in pregnancy handout given. Patient agreed/understood plan.  DE LA CRUZ,IVY 01/25/2011, 3:11 PM

## 2011-01-25 NOTE — Progress Notes (Signed)
Pt wants the baby to have no immunizations, Vitamin K injection, or eye drops after birth.

## 2011-01-25 NOTE — Progress Notes (Signed)
Irreg ctx, bleeding started this morning at 0800, clots at that time.

## 2011-01-25 NOTE — ED Provider Notes (Signed)
Reviewed and supervised. Agree with note. Reassuring fetal status

## 2011-01-26 ENCOUNTER — Inpatient Hospital Stay (HOSPITAL_COMMUNITY): Admission: RE | Admit: 2011-01-26 | Payer: Self-pay | Source: Ambulatory Visit

## 2011-01-27 ENCOUNTER — Inpatient Hospital Stay (HOSPITAL_COMMUNITY)
Admission: AD | Admit: 2011-01-27 | Discharge: 2011-01-31 | DRG: 766 | Disposition: A | Discharge status: Home / Self Care | Payer: Medicaid Other | Source: Ambulatory Visit | Attending: Obstetrics & Gynecology | Admitting: Obstetrics & Gynecology

## 2011-01-27 ENCOUNTER — Encounter (HOSPITAL_COMMUNITY): Payer: Self-pay | Admitting: *Deleted

## 2011-01-27 ENCOUNTER — Other Ambulatory Visit: Payer: Self-pay

## 2011-01-27 DIAGNOSIS — E669 Obesity, unspecified: Secondary | ICD-10-CM

## 2011-01-27 DIAGNOSIS — O4690 Antepartum hemorrhage, unspecified, unspecified trimester: Secondary | ICD-10-CM

## 2011-01-27 DIAGNOSIS — O324XX0 Maternal care for high head at term, not applicable or unspecified: Secondary | ICD-10-CM

## 2011-01-27 DIAGNOSIS — O480 Post-term pregnancy: Secondary | ICD-10-CM

## 2011-01-27 DIAGNOSIS — IMO0002 Reserved for concepts with insufficient information to code with codable children: Secondary | ICD-10-CM

## 2011-01-27 DIAGNOSIS — Z3480 Encounter for supervision of other normal pregnancy, unspecified trimester: Secondary | ICD-10-CM

## 2011-01-27 LAB — RPR: RPR Ser Ql: NONREACTIVE

## 2011-01-27 LAB — CBC
HCT: 32.3 % — ABNORMAL LOW (ref 36.0–46.0)
Hemoglobin: 10.4 g/dL — ABNORMAL LOW (ref 12.0–15.0)
MCH: 26.5 pg (ref 26.0–34.0)
MCV: 82.4 fL (ref 78.0–100.0)
RBC: 3.92 MIL/uL (ref 3.87–5.11)

## 2011-01-27 MED ORDER — ACETAMINOPHEN 325 MG PO TABS: 650.0000 mg | ORAL_TABLET | ORAL | Status: DC | PRN

## 2011-01-27 MED ORDER — OXYCODONE-ACETAMINOPHEN 5-325 MG PO TABS: 2.0000 | ORAL_TABLET | ORAL | Status: DC | PRN

## 2011-01-27 MED ORDER — LIDOCAINE HCL (PF) 1 % IJ SOLN
30.0000 mL | INTRAMUSCULAR | Status: DC | PRN
  Filled 2011-01-27: qty 30

## 2011-01-27 MED ORDER — FLEET ENEMA 7-19 GM/118ML RE ENEM: 1.0000 | ENEMA | RECTAL | Status: DC | PRN

## 2011-01-27 MED ORDER — TERBUTALINE SULFATE 1 MG/ML IJ SOLN: 0.2500 mg | Freq: Once | INTRAMUSCULAR | Status: AC | PRN

## 2011-01-27 MED ORDER — OXYTOCIN 20 UNITS IN LACTATED RINGERS INFUSION - SIMPLE
1.0000 m[IU]/min | INTRAVENOUS | Status: DC
  Administered 2011-01-27: 2 m[IU]/min via INTRAVENOUS
  Filled 2011-01-27: qty 1000

## 2011-01-27 MED ORDER — LACTATED RINGERS IV SOLN
INTRAVENOUS | Status: DC
  Administered 2011-01-27: 125 mL/h via INTRAVENOUS
  Administered 2011-01-27 – 2011-01-28 (×3): via INTRAVENOUS

## 2011-01-27 MED ORDER — OXYTOCIN BOLUS FROM INFUSION
500.0000 mL | Freq: Once | INTRAVENOUS | Status: DC
  Filled 2011-01-27: qty 500

## 2011-01-27 MED ORDER — NALBUPHINE SYRINGE 5 MG/0.5 ML
10.0000 mg | INJECTION | INTRAMUSCULAR | Status: DC | PRN
  Administered 2011-01-27: 5 mg via INTRAVENOUS
  Filled 2011-01-27: qty 0.5

## 2011-01-27 MED ORDER — NALBUPHINE SYRINGE 5 MG/0.5 ML
10.0000 mg | INJECTION | INTRAMUSCULAR | Status: DC | PRN
  Administered 2011-01-27 – 2011-01-28 (×3): 10 mg via INTRAVENOUS
  Filled 2011-01-27 (×4): qty 1

## 2011-01-27 MED ORDER — CITRIC ACID-SODIUM CITRATE 334-500 MG/5ML PO SOLN
30.0000 mL | ORAL | Status: DC | PRN
  Administered 2011-01-28: 30 mL via ORAL
  Filled 2011-01-27: qty 15

## 2011-01-27 MED ORDER — ONDANSETRON HCL 4 MG/2ML IJ SOLN: 4.0000 mg | Freq: Four times a day (QID) | INTRAMUSCULAR | Status: DC | PRN

## 2011-01-27 MED ORDER — FENTANYL CITRATE 0.05 MG/ML IJ SOLN: 100.0000 ug | INTRAMUSCULAR | Status: DC | PRN

## 2011-01-27 MED ORDER — LACTATED RINGERS IV SOLN: 500.0000 mL | INTRAVENOUS | Status: DC | PRN

## 2011-01-27 MED ORDER — IBUPROFEN 600 MG PO TABS: 600.0000 mg | ORAL_TABLET | Freq: Four times a day (QID) | ORAL | Status: DC | PRN

## 2011-01-27 MED ORDER — OXYTOCIN 20 UNITS IN LACTATED RINGERS INFUSION - SIMPLE: 125.0000 mL/h | Freq: Once | INTRAVENOUS | Status: DC

## 2011-01-27 NOTE — Progress Notes (Signed)
Subjective: Irregular contractions, but strong.  No complaints or concerns.  Objective: BP 87/50  Pulse 93  Temp(Src) 98.1 F (36.7 C) (Oral)  Resp 20  Ht 5\' 2"  (1.575 m)  Wt 101.152 kg (223 lb)  BMI 40.79 kg/m2      FHT:  FHR: 120-130 bpm, variability: moderate,  accelerations:  Present,  decelerations:  Absent UC:   irregular, every 5-10 minutes SVE:   Dilation: 3 Effacement (%): 70 Station: -2 Exam by:: Enis Slipper, RN  Labs: Lab Results  Component Value Date   WBC 11.5* 01/27/2011   HGB 10.4* 01/27/2011   HCT 32.3* 01/27/2011   MCV 82.4 01/27/2011   PLT 167 01/27/2011    Assessment / Plan: continue titrating pitocin.  Category 1 tracing.  No meds.   Continue present management.  Rhonda Jackson, Rhonda Jackson 01/27/2011, 8:47 AM

## 2011-01-27 NOTE — Progress Notes (Signed)
Rhonda Jackson is a 35 y.o. [id number] at [redacted]w[redacted]d admitted for induction of labor due to Post dates.   Subjective:  Feeling pressure but not vaginally  Objective: BP 116/60  Pulse 92  Temp(Src) 98.1 F (36.7 C) (Oral)  Resp 20  Ht 5\' 2"  (1.575 m)  Wt 101.152 kg (223 lb)  BMI 40.79 kg/m2      FHT:  FHR: 125 bpm, variability: moderate,  accelerations:  Present,  decelerations:  Absent UC:   regular, every 3-4 minutes SVE:   Dilation: 3 Effacement (%): 70 Station: -2 Exam by:: Rhonda Slipper, RN  Labs: Lab Results  Component Value Date   WBC 11.5* 01/27/2011   HGB 10.4* 01/27/2011   HCT 32.3* 01/27/2011   MCV 82.4 01/27/2011   PLT 167 01/27/2011    Assessment / Plan: Induction of labor due to postterm,  progressing well on pitocin  Labor: Progressing on Pitocin Fetal Wellbeing:  Category II Pain Control:  Labor support without medications I/D:  n/a Anticipated MOD:  NSVD  Rhonda Jackson 01/27/2011, 11:57 AM

## 2011-01-27 NOTE — Progress Notes (Signed)
Leni Pankonin is a 35 y.o. [id number] at 41w1admitted for induction of labor due to Post dates.  Subjective: A lot of pain. Doesn't want to do this anymore. Says wants a c-section. Willing to get IV pain meds now. No epidural.    Objective: BP 101/76  Pulse 95  Temp(Src) 98.1 F (36.7 C) (Oral)  Resp 20  Ht 5\' 2"  (1.575 m)  Wt 101.152 kg (223 lb)  BMI 40.79 kg/m2      FHT:  FHR: 145 bpm, variability: moderate,  accelerations:  Present,  decelerations:  Absent UC:   Poor tracing SVE:   Dilation: 5.5 Effacement (%): 80 Station: -2 Exam by:: Enis Slipper, RN  Labs: Lab Results  Component Value Date   WBC 11.5* 01/27/2011   HGB 10.4* 01/27/2011   HCT 32.3* 01/27/2011   MCV 82.4 01/27/2011   PLT 167 01/27/2011    Assessment / Plan: Induction of labor due to postterm,  progressing well on pitocin  Labor: Progressing on Pitocin Fetal Wellbeing:  Category I Pain Control:  Labor support without medications I/D:  n/a Anticipated MOD:  NSVD  HUNTER, STEPHEN 01/27/2011, 6:20 PM

## 2011-01-27 NOTE — Progress Notes (Signed)
Pt reports bleeding " a lot"  And contractions x 3 days, , seen on Saturday and was 1 cm on Sat

## 2011-01-27 NOTE — Progress Notes (Signed)
Subjective: Patient having quite a bit of pain - nubain helpful.  Objective: BP 108/81  Pulse 113  Temp(Src) 98.1 F (36.7 C) (Oral)  Resp 20  Ht 5\' 2"  (1.575 m)  Wt 101.152 kg (223 lb)  BMI 40.79 kg/m2      FHT:  FHR: 130s bpm, variability: moderate,  accelerations:  Present,  decelerations:  Absent UC:   regular, every 3-5 minutes SVE:   Dilation: 5.5 Effacement (%): 80 Station: -2 Exam by:: Enis Slipper, RN  Labs: Lab Results  Component Value Date   WBC 11.5* 01/27/2011   HGB 10.4* 01/27/2011   HCT 32.3* 01/27/2011   MCV 82.4 01/27/2011   PLT 167 01/27/2011    Assessment / Plan: IOL for postdates.  Category 1 tracing.  Not much progression - IUPC placed.  Anticipate vaginal delivery.  Rhonda Jackson 01/27/2011, 6:55 PM

## 2011-01-27 NOTE — Progress Notes (Signed)
Subjective: Patient uncomfortable with contractions.  Patient having more contractions.  Objective: BP 134/77  Pulse 103  Temp(Src) 97.8 F (36.6 C) (Oral)  Resp 20  Ht 5\' 2"  (1.575 m)  Wt 101.152 kg (223 lb)  BMI 40.79 kg/m2      FHT:  FHR: 130 bpm, variability: moderate,  accelerations:  Present,  decelerations:  Absent UC:   regular, every 3-5 minutes SVE:   Dilation: 4 Effacement (%): 80 Station: -1 Exam by:: Enis Slipper, RN  Labs: Lab Results  Component Value Date   WBC 11.5* 01/27/2011   HGB 10.4* 01/27/2011   HCT 32.3* 01/27/2011   MCV 82.4 01/27/2011   PLT 167 01/27/2011    Assessment / Plan: Continue titrating pitocin.  Category 1 tracing.  Expect NSVD.  Rhonda Jackson, Rhonda Jackson 01/27/2011, 1:31 PM

## 2011-01-27 NOTE — H&P (Signed)
Rhonda Jackson is a 35 y.o., [id number] at [redacted]w[redacted]d female presenting in early labor, with complaints of small amount bright red vaginal bleeding since Sat with pea sized clots- was seen in [facility]U and was determined to be bloody show and d/c'd.  Overall decreased fm in last few week- last perceived fm w/in last hour.  Denies LOF.  Has IOL scheduled for 02/01/11.  Maternal Medical History:  Reason for admission: Reason for admission: contractions and vaginal bleeding.  Contractions: Onset was 13-24 hours ago.   Frequency: irregular.   Perceived severity is moderate.    Fetal activity: Perceived fetal activity is decreased.   Last perceived fetal movement was within the past hour.    Prenatal complications: Bleeding (In Oct- "from yeast infection", small amount bright red vag bleeding since Sat 12/1).     OB History    Grav Para Term Preterm Abortions TAB SAB Ect Mult Living   7 2 2  4 3  0 1  2     Past Medical History  Diagnosis Date   Abnormal Pap smear    Tuberculosis     hx +PPD   History of chlamydia    Obese    AMA (advanced maternal age) multigravida 35+    TB was in childhood (~35yo) with no problems since, had negative chest xray when pregnant w/ 12yo son  Past Surgical History  Procedure Date   Dilation and curettage of uterus     tab x2   Colposcopy    Wisdom tooth extraction    Family History: family history includes Cancer in her mother; Emphysema in her father; Gout in her father; Osteoporosis in her mother; and Sleep apnea in her father.  There is no history of Anesthesia problems, and Hypotension, and Malignant hyperthermia, and Pseudochol deficiency, . Social History:  reports that she has never smoked. She has never used smokeless tobacco. She reports that she does not drink alcohol or use illicit drugs.  Review of Systems  Constitutional: Negative.   HENT: Positive for congestion (URI since Friday) and sore throat.   Eyes: Negative.   Respiratory: Positive  for cough and sputum production (thick green phlegm since Friday). Negative for shortness of breath.   Cardiovascular: Negative.   Gastrointestinal: Positive for abdominal pain (UC's).  Genitourinary: Negative.   Musculoskeletal: Negative.   Skin: Negative.   Neurological: Negative.   Endo/Heme/Allergies: Negative.   Psychiatric/Behavioral: Negative.     Dilation: 3 Effacement (%): 50 Station: -3 Exam by:: Booker,CNM Blood pressure 126/66, pulse 98, resp. rate 18, height 5\' 2"  (1.575 m), weight 101.152 kg (223 lb).  Vertex position confirmed by bedside u/s by suzanne shores, cnm  Maternal Exam:  Uterine Assessment: Contraction strength is mild.  Contraction frequency is irregular.   Abdomen: Patient reports no abdominal tenderness. Fetal presentation: vertex  Introitus: Normal vulva. Normal vagina.    Physical Exam  Constitutional: She is oriented to person, place, and time. She appears well-developed and well-nourished.  HENT:  Head: Normocephalic.  Eyes: Pupils are equal, round, and reactive to light.  Neck: Normal range of motion.  Cardiovascular: Normal rate and regular rhythm.   Respiratory: Effort normal and breath sounds normal. She has no wheezes.  GI: Soft.       Gravid  Genitourinary: Vagina normal and uterus normal.  Musculoskeletal: Normal range of motion.  Neurological: She is alert and oriented to person, place, and time. She has normal reflexes.  Skin: Skin is warm and dry.  Psychiatric: She has  a normal mood and affect. Her behavior is normal. Judgment and thought content normal.    Prenatal labs: ABO, Rh: --/--/O POS, O POS (10/24 0810) Antibody: NEG (10/24 0810) Rubella: 18.8 (10/24 0810) RPR: NON REACTIVE (10/24 0810)  HBsAg: NEGATIVE (10/24 0810)  HIV: NON REACTIVE (10/24 0810)  GBS: Negative (11/10 0000)   Assessment/Plan: A: Postdates IUP @ 41.1wks      Early labor     Vaginal bleeding of unknown etiology     Cat I FHR     Stable  maternal/fetal unit  P: Admit to L&D     Pitocin augmentation     Desires natural labor/birth     Expect NSVD     H/O TB discussed w/ L&D charge, infection control called- no need for isolation      Plan of care reviewed with Maylon Cos, CNM Joellyn Haff, SNM 01/27/2011, 6:02 AM

## 2011-01-27 NOTE — Progress Notes (Signed)
Rhonda Jackson is a 35 y.o. [id number] at [redacted]w[redacted]d admitted for induction of labor due to Post dates.  Subjective:  Uncomfortable with contractions  Objective: BP 108/52  Pulse 81  Temp(Src) 97.8 F (36.6 C) (Oral)  Resp 18  Ht 5\' 2"  (1.575 m)  Wt 101.152 kg (223 lb)  BMI 40.79 kg/m2      FHT:  FHR: 140 bpm, variability: moderate,  accelerations:  Present,  decelerations:  Present occasional variable.  UC:   regular, every 2-4 minutes SVE:   Dilation: 4 Effacement (%): 80 Station: -1 Exam by:: dr. Adrian Blackwater  Labs: Lab Results  Component Value Date   WBC 11.5* 01/27/2011   HGB 10.4* 01/27/2011   HCT 32.3* 01/27/2011   MCV 82.4 01/27/2011   PLT 167 01/27/2011    Assessment / Plan: Induction of labor due to postterm,  progressing well on pitocin  Labor: AROM to assist with dilation of cervix Fetal Wellbeing:  Category II Pain Control:  Labor support without medications I/D:  n/a Anticipated MOD:  NSVD  Supervised by Candelaria Celeste, DO. Dr. Adrian Blackwater AROM.   Tana Conch 01/27/2011, 3:49 PM

## 2011-01-27 NOTE — Progress Notes (Signed)
Subjective: Patient seen - having quite a bit of pain.  Using nubain.  Patient previously refusing increase in pitocin.  Objective: BP 124/82  Pulse 111  Temp(Src) 98.1 F (36.7 C) (Oral)  Resp 20  Ht 5\' 2"  (1.575 m)  Wt 101.152 kg (223 lb)  BMI 40.79 kg/m2     FHT:  FHR: 130s bpm, variability: moderate,  accelerations:  Present,  decelerations:  Absent UC:   irregular, every 3-5 minutes SVE:   Dilation: 6 Effacement (%): 80 Station: -2 Exam by:: a. white rn  Labs: Lab Results  Component Value Date   WBC 11.5* 01/27/2011   HGB 10.4* 01/27/2011   HCT 32.3* 01/27/2011   MCV 82.4 01/27/2011   PLT 167 01/27/2011    Assessment / Plan: IOL for postdates.  Discussed need to increase pitocin.  Patient agreeable - continue titrating pitocin - @12 .  Category 1 tracing.  Continue with nubain.    STINSON, JACOB JEHIEL 01/27/2011, 8:50 PM

## 2011-01-28 ENCOUNTER — Encounter (HOSPITAL_COMMUNITY): Payer: Self-pay | Admitting: *Deleted

## 2011-01-28 ENCOUNTER — Encounter (HOSPITAL_COMMUNITY): Payer: Self-pay | Admitting: Anesthesiology

## 2011-01-28 ENCOUNTER — Inpatient Hospital Stay (HOSPITAL_COMMUNITY): Payer: Medicaid Other | Admitting: Anesthesiology

## 2011-01-28 ENCOUNTER — Encounter (HOSPITAL_COMMUNITY)
Admission: AD | Disposition: A | Discharge status: Home / Self Care | Payer: Self-pay | Source: Ambulatory Visit | Attending: Obstetrics & Gynecology

## 2011-01-28 SURGERY — Surgical Case
Anesthesia: Regional | Site: Abdomen | Wound class: Clean Contaminated

## 2011-01-28 MED ORDER — ONDANSETRON HCL 4 MG/2ML IJ SOLN
INTRAMUSCULAR | Status: AC
  Filled 2011-01-28: qty 2

## 2011-01-28 MED ORDER — LACTATED RINGERS IV SOLN: INTRAVENOUS | Status: DC

## 2011-01-28 MED ORDER — NALBUPHINE HCL 10 MG/ML IJ SOLN
5.0000 mg | INTRAMUSCULAR | Status: DC | PRN
  Filled 2011-01-28: qty 1

## 2011-01-28 MED ORDER — OXYCODONE-ACETAMINOPHEN 5-325 MG PO TABS
1.0000 | ORAL_TABLET | ORAL | Status: DC | PRN
  Administered 2011-01-29 (×2): 1 via ORAL
  Administered 2011-01-29: 2 via ORAL
  Administered 2011-01-29 (×2): 1 via ORAL
  Administered 2011-01-30 – 2011-01-31 (×8): 2 via ORAL
  Filled 2011-01-28 (×6): qty 2
  Filled 2011-01-28 (×2): qty 1
  Filled 2011-01-28: qty 2
  Filled 2011-01-28: qty 1
  Filled 2011-01-28 (×3): qty 2
  Filled 2011-01-28: qty 1

## 2011-01-28 MED ORDER — ONDANSETRON HCL 4 MG/2ML IJ SOLN: 4.0000 mg | INTRAMUSCULAR | Status: DC | PRN

## 2011-01-28 MED ORDER — KETOROLAC TROMETHAMINE 30 MG/ML IJ SOLN
30.0000 mg | Freq: Four times a day (QID) | INTRAMUSCULAR | Status: AC | PRN
  Administered 2011-01-28: 30 mg via INTRAVENOUS
  Filled 2011-01-28: qty 1

## 2011-01-28 MED ORDER — CEFAZOLIN SODIUM-DEXTROSE 2-3 GM-% IV SOLR
2.0000 g | INTRAVENOUS | Status: DC
  Administered 2011-01-28: 2 g via INTRAVENOUS
  Filled 2011-01-28: qty 50

## 2011-01-28 MED ORDER — PRENATAL PLUS 27-1 MG PO TABS
1.0000 | ORAL_TABLET | Freq: Every day | ORAL | Status: DC
  Administered 2011-01-28 – 2011-01-31 (×3): 1 via ORAL
  Filled 2011-01-28 (×3): qty 1

## 2011-01-28 MED ORDER — MORPHINE SULFATE (PF) 0.5 MG/ML IJ SOLN
INTRAMUSCULAR | Status: DC | PRN
  Administered 2011-01-28: .1 mg via INTRATHECAL

## 2011-01-28 MED ORDER — KETOROLAC TROMETHAMINE 30 MG/ML IJ SOLN: 30.0000 mg | Freq: Four times a day (QID) | INTRAMUSCULAR | Status: AC | PRN

## 2011-01-28 MED ORDER — SODIUM CHLORIDE 0.9 % IV SOLN
1.0000 ug/kg/h | INTRAVENOUS | Status: DC | PRN
  Filled 2011-01-28: qty 2.5

## 2011-01-28 MED ORDER — DIPHENHYDRAMINE HCL 25 MG PO CAPS: 25.0000 mg | ORAL_CAPSULE | Freq: Four times a day (QID) | ORAL | Status: DC | PRN

## 2011-01-28 MED ORDER — IBUPROFEN 600 MG PO TABS
600.0000 mg | ORAL_TABLET | Freq: Four times a day (QID) | ORAL | Status: DC
  Administered 2011-01-29 – 2011-01-31 (×11): 600 mg via ORAL
  Filled 2011-01-28 (×12): qty 1

## 2011-01-28 MED ORDER — PHENYLEPHRINE 40 MCG/ML (10ML) SYRINGE FOR IV PUSH (FOR BLOOD PRESSURE SUPPORT)
PREFILLED_SYRINGE | INTRAVENOUS | Status: AC
  Filled 2011-01-28: qty 5

## 2011-01-28 MED ORDER — KETOROLAC TROMETHAMINE 60 MG/2ML IM SOLN
INTRAMUSCULAR | Status: AC
  Administered 2011-01-28: 60 mg via INTRAMUSCULAR
  Filled 2011-01-28: qty 2

## 2011-01-28 MED ORDER — MEASLES, MUMPS & RUBELLA VAC ~~LOC~~ INJ
0.5000 mL | INJECTION | Freq: Once | SUBCUTANEOUS | Status: DC
  Filled 2011-01-28: qty 0.5

## 2011-01-28 MED ORDER — BUPIVACAINE IN DEXTROSE 0.75-8.25 % IT SOLN
INTRATHECAL | Status: DC | PRN
  Administered 2011-01-28: 1.5 mL via INTRATHECAL

## 2011-01-28 MED ORDER — OXYTOCIN 20 UNITS IN LACTATED RINGERS INFUSION - SIMPLE: 125.0000 mL/h | INTRAVENOUS | Status: AC

## 2011-01-28 MED ORDER — ONDANSETRON HCL 4 MG/2ML IJ SOLN
INTRAMUSCULAR | Status: DC | PRN
  Administered 2011-01-28: 4 mg via INTRAVENOUS

## 2011-01-28 MED ORDER — FENTANYL CITRATE 0.05 MG/ML IJ SOLN: 25.0000 ug | INTRAMUSCULAR | Status: DC | PRN

## 2011-01-28 MED ORDER — LANOLIN HYDROUS EX OINT: 1.0000 "application " | TOPICAL_OINTMENT | CUTANEOUS | Status: DC | PRN

## 2011-01-28 MED ORDER — MENTHOL 3 MG MT LOZG: 1.0000 | LOZENGE | OROMUCOSAL | Status: DC | PRN

## 2011-01-28 MED ORDER — WITCH HAZEL-GLYCERIN EX PADS: 1.0000 "application " | MEDICATED_PAD | CUTANEOUS | Status: DC | PRN

## 2011-01-28 MED ORDER — OXYTOCIN 10 UNIT/ML IJ SOLN
INTRAMUSCULAR | Status: AC
  Filled 2011-01-28: qty 2

## 2011-01-28 MED ORDER — ONDANSETRON HCL 4 MG PO TABS: 4.0000 mg | ORAL_TABLET | ORAL | Status: DC | PRN

## 2011-01-28 MED ORDER — SODIUM CHLORIDE 0.9 % IJ SOLN: 3.0000 mL | INTRAMUSCULAR | Status: DC | PRN

## 2011-01-28 MED ORDER — NALOXONE HCL 0.4 MG/ML IJ SOLN: 0.4000 mg | INTRAMUSCULAR | Status: DC | PRN

## 2011-01-28 MED ORDER — ONDANSETRON HCL 4 MG/2ML IJ SOLN: 4.0000 mg | Freq: Three times a day (TID) | INTRAMUSCULAR | Status: DC | PRN

## 2011-01-28 MED ORDER — OXYTOCIN 20 UNITS IN LACTATED RINGERS INFUSION - SIMPLE
INTRAVENOUS | Status: AC
  Filled 2011-01-28: qty 1000

## 2011-01-28 MED ORDER — FENTANYL CITRATE 0.05 MG/ML IJ SOLN
INTRAMUSCULAR | Status: DC | PRN
  Administered 2011-01-28: 15 ug via INTRATHECAL

## 2011-01-28 MED ORDER — MEPERIDINE HCL 25 MG/ML IJ SOLN: 6.2500 mg | INTRAMUSCULAR | Status: DC | PRN

## 2011-01-28 MED ORDER — DIPHENHYDRAMINE HCL 50 MG/ML IJ SOLN: 12.5000 mg | INTRAMUSCULAR | Status: DC | PRN

## 2011-01-28 MED ORDER — KETOROLAC TROMETHAMINE 60 MG/2ML IM SOLN
60.0000 mg | Freq: Once | INTRAMUSCULAR | Status: AC | PRN
  Administered 2011-01-28: 60 mg via INTRAMUSCULAR

## 2011-01-28 MED ORDER — DIPHENHYDRAMINE HCL 50 MG/ML IJ SOLN: 25.0000 mg | INTRAMUSCULAR | Status: DC | PRN

## 2011-01-28 MED ORDER — DIBUCAINE 1 % RE OINT: 1.0000 "application " | TOPICAL_OINTMENT | RECTAL | Status: DC | PRN

## 2011-01-28 MED ORDER — SIMETHICONE 80 MG PO CHEW
80.0000 mg | CHEWABLE_TABLET | ORAL | Status: DC | PRN
  Administered 2011-01-29: 80 mg via ORAL

## 2011-01-28 MED ORDER — FENTANYL CITRATE 0.05 MG/ML IJ SOLN
INTRAMUSCULAR | Status: AC
  Filled 2011-01-28: qty 2

## 2011-01-28 MED ORDER — SCOPOLAMINE 1 MG/3DAYS TD PT72
1.0000 | MEDICATED_PATCH | Freq: Once | TRANSDERMAL | Status: DC
  Filled 2011-01-28: qty 1

## 2011-01-28 MED ORDER — IBUPROFEN 600 MG PO TABS: 600.0000 mg | ORAL_TABLET | Freq: Four times a day (QID) | ORAL | Status: DC | PRN

## 2011-01-28 MED ORDER — MORPHINE SULFATE 0.5 MG/ML IJ SOLN
INTRAMUSCULAR | Status: AC
  Filled 2011-01-28: qty 10

## 2011-01-28 MED ORDER — DIPHENHYDRAMINE HCL 25 MG PO CAPS: 25.0000 mg | ORAL_CAPSULE | ORAL | Status: DC | PRN

## 2011-01-28 MED ORDER — METOCLOPRAMIDE HCL 5 MG/ML IJ SOLN: 10.0000 mg | Freq: Three times a day (TID) | INTRAMUSCULAR | Status: DC | PRN

## 2011-01-28 MED ORDER — PHENYLEPHRINE HCL 10 MG/ML IJ SOLN
INTRAMUSCULAR | Status: DC | PRN
  Administered 2011-01-28 (×2): 80 ug via INTRAVENOUS
  Administered 2011-01-28: 40 ug via INTRAVENOUS

## 2011-01-28 MED ORDER — TETANUS-DIPHTH-ACELL PERTUSSIS 5-2.5-18.5 LF-MCG/0.5 IM SUSP: 0.5000 mL | Freq: Once | INTRAMUSCULAR | Status: DC

## 2011-01-28 MED ORDER — OXYTOCIN 20 UNITS IN LACTATED RINGERS INFUSION - SIMPLE
INTRAVENOUS | Status: DC | PRN
  Administered 2011-01-28: 20 [IU] via INTRAVENOUS

## 2011-01-28 MED ORDER — MISOPROSTOL 200 MCG PO TABS
ORAL_TABLET | ORAL | Status: AC
  Filled 2011-01-28: qty 5

## 2011-01-28 MED ORDER — LACTATED RINGERS IV SOLN
INTRAVENOUS | Status: DC | PRN
  Administered 2011-01-28 (×3): via INTRAVENOUS

## 2011-01-28 SURGICAL SUPPLY — 30 items
CHLORAPREP W/TINT 26ML (MISCELLANEOUS) IMPLANT
CLOTH BEACON ORANGE TIMEOUT ST (SAFETY) ×2 IMPLANT
CONTAINER PREFILL 10% NBF 15ML (MISCELLANEOUS) IMPLANT
DRAIN JACKSON PRT FLT 7MM (DRAIN) IMPLANT
DRESSING TELFA 8X3 (GAUZE/BANDAGES/DRESSINGS) IMPLANT
DRSG COVADERM 4X8 (GAUZE/BANDAGES/DRESSINGS) ×2 IMPLANT
ELECT REM PT RETURN 9FT ADLT (ELECTROSURGICAL) ×2
ELECTRODE REM PT RTRN 9FT ADLT (ELECTROSURGICAL) ×1 IMPLANT
EVACUATOR SILICONE 100CC (DRAIN) IMPLANT
EXTRACTOR VACUUM M CUP 4 TUBE (SUCTIONS) IMPLANT
GAUZE SPONGE 4X4 12PLY STRL LF (GAUZE/BANDAGES/DRESSINGS) ×4 IMPLANT
GLOVE BIO SURGEON STRL SZ7 (GLOVE) ×6 IMPLANT
GLOVE BIOGEL PI IND STRL 7.0 (GLOVE) ×2 IMPLANT
GLOVE BIOGEL PI INDICATOR 7.0 (GLOVE) ×2
GOWN PREVENTION PLUS LG XLONG (DISPOSABLE) ×6 IMPLANT
KIT ABG SYR 3ML LUER SLIP (SYRINGE) IMPLANT
NEEDLE HYPO 25X5/8 SAFETYGLIDE (NEEDLE) ×2 IMPLANT
NS IRRIG 1000ML POUR BTL (IV SOLUTION) ×2 IMPLANT
PACK C SECTION WH (CUSTOM PROCEDURE TRAY) ×2 IMPLANT
PAD ABD 7.5X8 STRL (GAUZE/BANDAGES/DRESSINGS) IMPLANT
RTRCTR C-SECT PINK 25CM LRG (MISCELLANEOUS) ×2 IMPLANT
SLEEVE SCD COMPRESS KNEE MED (MISCELLANEOUS) IMPLANT
STAPLER VISISTAT 35W (STAPLE) IMPLANT
SUT MON AB 2-0 CT1 36 (SUTURE) ×2 IMPLANT
SUT VIC AB 0 CTX 36 (SUTURE) ×5
SUT VIC AB 0 CTX36XBRD ANBCTRL (SUTURE) ×5 IMPLANT
SUT VIC AB 4-0 KS 27 (SUTURE) IMPLANT
TOWEL OR 17X24 6PK STRL BLUE (TOWEL DISPOSABLE) ×4 IMPLANT
TRAY FOLEY CATH 14FR (SET/KITS/TRAYS/PACK) ×2 IMPLANT
WATER STERILE IRR 1000ML POUR (IV SOLUTION) ×2 IMPLANT

## 2011-01-28 NOTE — Progress Notes (Signed)
Rhonda Jackson is a 35 y.o. [id number] at [redacted]w[redacted]d induction for post dates Subjective:   Objective: BP 115/24  Pulse 89  Temp(Src) 98.4 F (36.9 C) (Oral)  Resp 20  Ht 5\' 2"  (1.575 m)  Wt 101.152 kg (223 lb)  BMI 40.79 kg/m2  SpO2 97%      FHT:  FHR: 130 bpm, variability: moderate,  accelerations:  Present,  decelerations:  Present variables with pushing, resolved not. UC:   regular, every 3 minutes SVE:   Pushes baby to +1, head recedes to 0 when not pushing. Labs: Lab Results  Component Value Date   WBC 11.5* 01/27/2011   HGB 10.4* 01/27/2011   HCT 32.3* 01/27/2011   MCV 82.4 01/27/2011   PLT 167 01/27/2011    Assessment / Plan: Arrest of decent  Labor: pr not pushing anymore.  very tired.  not a candidate for vacuum (0 to +1 station, OP and significant molding) Preeclampsia:  no signs or symptoms of toxicity Fetal Wellbeing:  Category I Pain Control:  Labor support without medications I/D:  n/a Anticipated MOD:  C/s  Patient consented fro cesarean section.  Risks include but not limited to bleeding, infection, damage to intraabdominal organs, hysterectomy, DVT/PE.  Or notified at 5:40 am  Rhonda Neisler H. 01/28/2011, 5:44 AM

## 2011-01-28 NOTE — Consult Note (Signed)
Neonatology Note:   Attendance at C-section:    I was asked to attend this primary C/S at term due to failure of descent. The mother is a G7P2A4 O pos, GBS neg. ROM 15 hours prior to delivery, fluid clear. Infant vigorous with good spontaneous cry and tone. Needed only minimal bulb suctioning. Ap 9/10. Lungs clear to ausc in DR. To CN to care of Pediatrician.   Deatra James, MD

## 2011-01-28 NOTE — Progress Notes (Signed)
OR called and ready for pt in rm 1

## 2011-01-28 NOTE — Progress Notes (Signed)
When md gets to room , pt states that she is no longer feeling pressure with ucs, instructed to call out if she has the sensation to push again

## 2011-01-28 NOTE — Progress Notes (Signed)
  Subjective: Patient standing and rocking with the contractions - does not wish for epidural.  Objective: BP 97/63  Pulse 95  Temp(Src) 98.3 F (36.8 C) (Oral)  Resp 20  Ht 5\' 2"  (1.575 m)  Wt 101.152 kg (223 lb)  BMI 40.79 kg/m2     FHT:  FHR: 140s bpm, variability: moderate,  accelerations:  Present,  decelerations:  Absent UC:   regular, every 2-3 minutes SVE:   Dilation: 7 Effacement (%): 90;80 Station: -1 Exam by:: Hawkins Seaman, DO  Labs: Lab Results  Component Value Date   WBC 11.5* 01/27/2011   HGB 10.4* 01/27/2011   HCT 32.3* 01/27/2011   MCV 82.4 01/27/2011   PLT 167 01/27/2011    Assessment / Plan: Minimal progression.  Category 1 tracing.  Will continue with pitocin.  Anticipate vaginal delivery.  Tyjon Bowen JEHIEL 01/28/2011, 12:11 AM

## 2011-01-28 NOTE — Progress Notes (Signed)
Pt states she does not have urge to push. md notified. Orders to resume pushing in 1 hour

## 2011-01-28 NOTE — Progress Notes (Signed)
Subjective: Patient doing well with nubain.    Objective: BP 126/64  Pulse 115  Temp(Src) 98.2 F (36.8 C) (Oral)  Resp 20  Ht 5\' 2"  (1.575 m)  Wt 101.152 kg (223 lb)  BMI 40.79 kg/m2      FHT:  FHR: 130s bpm, variability: moderate,  accelerations:  Present,  decelerations:  Absent UC:   regular, every 2-4 minutes SVE:   Dilation: 8 Effacement (%): 90;80 Station: -1 Exam by:: Dr. Adrian Blackwater  Labs: Lab Results  Component Value Date   WBC 11.5* 01/27/2011   HGB 10.4* 01/27/2011   HCT 32.3* 01/27/2011   MCV 82.4 01/27/2011   PLT 167 01/27/2011    Assessment / Plan: IOL for postdates.  Good cervical change.  Continue with pitocin and nubain.  anticipate vaginal delivery.  STINSON, JACOB JEHIEL 01/28/2011, 1:39 AM

## 2011-01-28 NOTE — Progress Notes (Signed)
Rhonda Jackson is a 35 y.o. [id number] at 58w2dinduction  Pt feeling pain.   Objective: BP 130/37  Pulse 105  Temp(Src) 99.1 F (37.3 C) (Axillary)  Resp 20  Ht 5\' 2"  (1.575 m)  Wt 101.152 kg (223 lb)  BMI 40.79 kg/m2      FHT:  FHR: 120 bpm, variability: moderate,  accelerations:  Present,  decelerations:  Present --mild variables UC:   regular, every 2 minutes SVE:   Dilation: Lip/rim Effacement (%): 100 Station: -2 Exam by:: Dr. Penne Lash  Labs: Lab Results  Component Value Date   WBC 11.5* 01/27/2011   HGB 10.4* 01/27/2011   HCT 32.3* 01/27/2011   MCV 82.4 01/27/2011   PLT 167 01/27/2011    Assessment / Plan: Made almost 2 cm change in  2 hours.  May begin pushing. Pt will try pushing and see if lip resolves. Baby feels OP.  Christepher Melchior H. 01/28/2011, 3:30 AM

## 2011-01-28 NOTE — Progress Notes (Signed)
Consent signed by pt

## 2011-01-28 NOTE — Progress Notes (Signed)
UR chart review completed.  

## 2011-01-28 NOTE — Transfer of Care (Signed)
Immediate Anesthesia Transfer of Care Note  Patient: Rhonda Jackson  Procedure(s) Performed:  CESAREAN SECTION  Patient Location: PACU  Anesthesia Type: Spinal  Level of Consciousness: awake  Airway & Oxygen Therapy: Patient Spontanous Breathing  Post-op Assessment: Report given to PACU RN  Post vital signs: Reviewed and stable  Complications: No apparent anesthesia complications

## 2011-01-28 NOTE — Op Note (Signed)
Judee Clara PROCEDURE DATE: 01/27/2011 - 01/28/2011  PREOPERATIVE DIAGNOSIS: Intrauterine pregnancy at  [redacted]w[redacted]d weeks gestation; failure to progress: arrest of descent  POSTOPERATIVE DIAGNOSIS: The same  PROCEDURE: Primary Low Transverse Cesarean Section  SURGEON:  Dr. Elsie Lincoln  ASSISTANT: Dr Candelaria Celeste  INDICATIONS: Rhonda Jackson is a 35 y.o. [id number] at [redacted]w[redacted]d taken to the operating room for primary low transverse cesarean section secondary to failure to progress: arrest of descent.  The risks of cesarean section discussed with the patient included but were not limited to: bleeding which may require transfusion or reoperation; infection which may require antibiotics; injury to bowel, bladder, ureters or other surrounding organs; injury to the fetus; need for additional procedures including hysterectomy in the event of a life-threatening hemorrhage; placental abnormalities wth subsequent pregnancies, incisional problems, thromboembolic phenomenon and other postoperative/anesthesia complications. The patient concurred with the proposed plan, giving informed written consent for the procedure.    FINDINGS:  Viable female infant in cephalic presentation.  Apgars 9 and 10, weight, 7 pounds and 9 ounces.  Clear amniotic fluid.  Intact placenta, three vessel cord.  Normal uterus, fallopian tubes and ovaries bilaterally.  ANESTHESIA:    Spinal INTRAVENOUS FLUIDS: 2400 ml ESTIMATED BLOOD LOSS: 600 ml URINE OUTPUT:  225 ml SPECIMENS: Placenta sent to L&D COMPLICATIONS: None immediate  PROCEDURE IN DETAIL:  The patient received intravenous antibiotics and had sequential compression devices applied to her lower extremities while in the preoperative area.  She was then taken to the operating room where spinal anesthesia was administered and was found to be adequate. She was then placed in a dorsal supine position with a leftward tilt, and prepped and draped in a sterile manner.  A foley  catheter was placed into her bladder and attached to constant gravity, which drained clear fluid throughout.  After an adequate timeout was performed, a Pfannenstiel skin incision was made with scalpel and carried through to the underlying layer of fascia. The fascia was incised in the midline and this incision was extended bilaterally using the Mayo scissors. Kocher clamps were applied to the superior aspect of the fascial incision and the underlying rectus muscles were dissected off bluntly. A similar process was carried out on the inferior aspect of the facial incision. The rectus muscles were separated in the midline bluntly and the peritoneum was entered bluntly. An Teacher, early years/pre was inserted.  Attention was turned to the lower uterine segment where a transverse hysterotomy was made with a scalpel and extended bilaterally bluntly.  The infant was successfully delivered, and cord was clamped and cut and infant was handed over to awaiting neonatology team. Uterine massage was then administered and the placenta delivered intact with three-vessel cord. The uterus was then cleared of clot and debris.  The hysterotomy was closed with 0 Vicryl in a running locked fashion, and an imbricating layer was also placed with a 0 Vicryl. Overall, excellent hemostasis was noted. Hemostasis was confirmed on all surfaces.  The fascia was then closed using 0 Vicryl in a running fashion.  The skin was closed with a 4-0 subcuticular stitch in a running fashion.  The patient tolerated the procedure well. Sponge, lap, instrument and needle counts were correct x 2. She was taken to the recovery room in stable condition.    Jerrian Mells JEHIEL DO 01/28/2011 8:08 AM

## 2011-01-28 NOTE — Progress Notes (Signed)
Orders for pt to start pushing; pt refusing at this time

## 2011-01-28 NOTE — Progress Notes (Signed)
Pt called out for md. When md in room pt refused vag exam and does not express needs

## 2011-01-28 NOTE — Anesthesia Postprocedure Evaluation (Signed)
Anesthesia Post Note  Patient: Rhonda Jackson  Procedure(s) Performed:  CESAREAN SECTION  Anesthesia type: Spinal  Patient location: PACU  Post pain: Pain level controlled  Post assessment: Post-op Vital signs reviewed  Last Vitals:  Filed Vitals:   01/28/11 0730  BP: 98/43  Pulse:   Temp:   Resp: 18    Post vital signs: Reviewed  Level of consciousness: awake  Complications: No apparent anesthesia complications

## 2011-01-28 NOTE — Progress Notes (Signed)
This note also relates to the following rows which could not be included: BP - Cannot attach notes to rows marked as read only Pulse Rate - Cannot attach notes to rows marked as read only  Pt educated about c-section; pt educated; verbalized understanding;

## 2011-01-28 NOTE — Addendum Note (Signed)
Addendum  created 01/28/11 1347 by Fanny Dance   Modules edited:Notes Section

## 2011-01-28 NOTE — Anesthesia Procedure Notes (Signed)
Spinal  Patient location during procedure: OR Start time: 01/28/2011 6:21 AM Staffing Performed by: anesthesiologist  Preanesthetic Checklist Completed: patient identified, site marked, surgical consent, pre-op evaluation, timeout performed, IV checked, risks and benefits discussed and monitors and equipment checked Spinal Block Patient position: sitting Prep: site prepped and draped and DuraPrep Patient monitoring: heart rate, cardiac monitor, continuous pulse ox and blood pressure Approach: midline Location: L3-4 Injection technique: single-shot Needle Needle type: Sprotte  Needle gauge: 24 G Needle length: 9 cm Assessment Sensory level: T4 Additional Notes Clear free flow CSF on first attempt.  Patient tolerated procedure well.

## 2011-01-28 NOTE — Progress Notes (Signed)
Pt back at bedside to allow to monitor fhr and uc's

## 2011-01-28 NOTE — Anesthesia Preprocedure Evaluation (Signed)
Anesthesia Evaluation  Patient identified by MRN, date of birth, ID band Patient awake    Reviewed: Allergy & Precautions, H&P , NPO status , Patient's Chart, lab work & pertinent test results, reviewed documented beta blocker date and time   History of Anesthesia Complications Negative for: history of anesthetic complications  Airway Mallampati: II      Dental  (+) Teeth Intact   Pulmonary neg pulmonary ROS,  clear to auscultation  Pulmonary exam normal       Cardiovascular neg cardio ROS regular Normal    Neuro/Psych Negative Neurological ROS  Negative Psych ROS   GI/Hepatic negative GI ROS, Neg liver ROS,   Endo/Other  Morbid obesity  Renal/GU negative Renal ROS  Genitourinary negative   Musculoskeletal   Abdominal   Peds  Hematology negative hematology ROS (+)   Anesthesia Other Findings   Reproductive/Obstetrics (+) Pregnancy (completely dilated, no epidural, pushing x 1.5 hr, now with failure to descend)                           Anesthesia Physical Anesthesia Plan  ASA: III and Emergent  Anesthesia Plan: Spinal   Post-op Pain Management:    Induction:   Airway Management Planned:   Additional Equipment:   Intra-op Plan:   Post-operative Plan:   Informed Consent: I have reviewed the patients History and Physical, chart, labs and discussed the procedure including the risks, benefits and alternatives for the proposed anesthesia with the patient or authorized representative who has indicated his/her understanding and acceptance.   Dental Advisory Given  Plan Discussed with: CRNA and Surgeon  Anesthesia Plan Comments:         Anesthesia Quick Evaluation

## 2011-01-28 NOTE — Progress Notes (Signed)
Pt standing in bathroom; refusing to go closer to monitor to assess  Baby. Will continue to educate and urge pt to allow me to reconnect monitors

## 2011-01-28 NOTE — Anesthesia Postprocedure Evaluation (Signed)
  Anesthesia Post-op Note  Patient: Rhonda Jackson  Procedure(s) Performed:  CESAREAN SECTION  Patient Location: Mother/Baby  Anesthesia Type: Spinal  Level of Consciousness: alert  and oriented  Airway and Oxygen Therapy: Patient Spontanous Breathing  Post-op Pain: mild  Post-op Assessment: Patient's Cardiovascular Status Stable and Respiratory Function Stable  Post-op Vital Signs: stable  Complications: No apparent anesthesia complications

## 2011-01-28 NOTE — Progress Notes (Signed)
Rhonda Jackson is a 35 y.o. [id number] at [redacted]w[redacted]d admitted for labor.   Subjective: Called to bedside by nurse.  Pt reports increased pressure with contractions and the desire to push.  Once in room, pt was too uncomfortable to lay down for exam.  Waited with her through 3 ctx, she states that she was not feeling the desire to push any longer.  Objective: BP 144/113  Pulse 85  Temp(Src) 98.3 F (36.8 C) (Oral)  Resp 20  Ht 5\' 2"  (1.575 m)  Wt 101.152 kg (223 lb)  BMI 40.79 kg/m2      FHT:  FHR: 140 bpm, variability: moderate,  accelerations:  Present,  decelerations:  Absent UC:   regular, every 4-5 minutes SVE:  deferred  Labs: Lab Results  Component Value Date   WBC 11.5* 01/27/2011   HGB 10.4* 01/27/2011   HCT 32.3* 01/27/2011   MCV 82.4 01/27/2011   PLT 167 01/27/2011    Assessment / Plan: Augmentation of labor, progressing well -pt instructed to alert nurse if she is consistently feeling pressure with contractions.   Labor: Progressing normally Fetal Wellbeing:  Category I Pain Control:  nubain Anticipated MOD:  NSVD  O'Grady, Eneida Evers 01/28/2011, 12:41 AM

## 2011-01-29 ENCOUNTER — Other Ambulatory Visit: Payer: Self-pay

## 2011-01-29 ENCOUNTER — Ambulatory Visit (HOSPITAL_COMMUNITY): Admission: RE | Admit: 2011-01-29 | Payer: Self-pay | Source: Ambulatory Visit

## 2011-01-29 ENCOUNTER — Encounter (HOSPITAL_COMMUNITY): Payer: Self-pay | Admitting: Obstetrics & Gynecology

## 2011-01-29 LAB — CBC
MCHC: 32.2 g/dL (ref 30.0–36.0)
Platelets: 153 10*3/uL (ref 150–400)
RDW: 16.3 % — ABNORMAL HIGH (ref 11.5–15.5)

## 2011-01-29 NOTE — H&P (Signed)
I have seen/examined this pt and agree with the above assessment and plan. Rhonda Hoots E.  

## 2011-01-29 NOTE — Progress Notes (Signed)
I have seen and examined patient with Min and I agree with her assessment and plan.

## 2011-01-29 NOTE — Progress Notes (Signed)
Subjective: Postpartum Day 1: Cesarean Delivery Patient reports incisional pain, tolerating PO, + flatus and + BM. Patient concerned about stitches not healing properly, pain with coughing. Taking percocet.  Objective: Vital signs in last 24 hours: Temp:  [97.3 F (36.3 C)-98.7 F (37.1 C)] 98.2 F (36.8 C) (12/05 0609) Pulse Rate:  [65-103] 95  (12/05 0609) Resp:  [10-20] 20  (12/05 0609) BP: (96-121)/(43-71) 121/60 mmHg (12/05 0609) SpO2:  [97 %-100 %] 97 % (12/04 2135)  Physical Exam:  General: alert, cooperative and no distress Lochia: appropriate Uterine Fundus: firm Incision: no significant drainage, no dehiscence, no significant erythema DVT Evaluation: No cords or calf tenderness.   Basename 01/29/11 0523 01/27/11 0611  HGB 8.3* 10.4*  HCT 25.8* 32.3*    Assessment/Plan: Status post Cesarean section. Doing well postoperatively.  Continue current care. Informed patient stitches are safe to move around with and cough. Pain should be managed with medications and encouraged patient to continue with activity. Patient expressed interest in bilateral tubal ligation. Discharge home tomorrow.  Mosetta Putt, PA-S 01/29/2011, 7:28 AM

## 2011-01-30 MED ORDER — FERROUS SULFATE 325 (65 FE) MG PO TABS
325.0000 mg | ORAL_TABLET | Freq: Two times a day (BID) | ORAL | Status: DC
  Administered 2011-01-30 – 2011-01-31 (×3): 325 mg via ORAL
  Filled 2011-01-30 (×3): qty 1

## 2011-01-30 MED ORDER — SENNOSIDES-DOCUSATE SODIUM 8.6-50 MG PO TABS
2.0000 | ORAL_TABLET | Freq: Every day | ORAL | Status: DC
  Administered 2011-01-30: 2 via ORAL

## 2011-01-30 MED ORDER — SIMETHICONE 80 MG PO CHEW
160.0000 mg | CHEWABLE_TABLET | Freq: Four times a day (QID) | ORAL | Status: DC | PRN
  Administered 2011-01-30: 160 mg via ORAL

## 2011-01-30 NOTE — Progress Notes (Signed)
Subjective: Postpartum Day 2: Cesarean Delivery Patient reports incisional pain.    Objective: Vital signs in last 24 hours: Temp:  [98 F (36.7 C)-98.3 F (36.8 C)] 98 F (36.7 C) (12/06 7846) Pulse Rate:  [90-93] 90  (12/06 0637) Resp:  [18] 18  (12/06 0637) BP: (116-125)/(76-78) 116/76 mmHg (12/06 9629)  Physical Exam:  General: alert, cooperative and no distress Lochia: appropriate Uterine Fundus: firm Incision: healing well, no significant drainage DVT Evaluation: No evidence of DVT seen on physical exam.   Basename 01/29/11 0523  HGB 8.3*  HCT 25.8*    Assessment/Plan: Status post Cesarean section. Doing well postoperatively.  Continue current care. Anticipate D/C home tomorrow Breastfeeding Supplement Fe with meals  Rhonda Jackson E. 01/30/2011, 6:43 AM

## 2011-01-31 MED ORDER — OXYCODONE-ACETAMINOPHEN 5-325 MG PO TABS: 1.0000 | ORAL_TABLET | Freq: Four times a day (QID) | ORAL | Status: AC | PRN

## 2011-01-31 MED ORDER — PRENATAL PLUS 27-1 MG PO TABS: 1.0000 | ORAL_TABLET | Freq: Every day | ORAL | Status: DC

## 2011-01-31 MED ORDER — IBUPROFEN 600 MG PO TABS: 600.0000 mg | ORAL_TABLET | Freq: Four times a day (QID) | ORAL | Status: AC

## 2011-01-31 MED ORDER — FERROUS SULFATE 325 (65 FE) MG PO TABS: 325.0000 mg | ORAL_TABLET | Freq: Two times a day (BID) | ORAL | Status: DC

## 2011-01-31 MED ORDER — SENNOSIDES-DOCUSATE SODIUM 8.6-50 MG PO TABS: 2.0000 | ORAL_TABLET | Freq: Every day | ORAL | Status: AC

## 2011-01-31 NOTE — Discharge Summary (Signed)
Obstetric Discharge Summary Reason for Admission: onset of labor Prenatal Procedures: NST Intrapartum Procedures: cesarean: low cervical, transverse Postpartum Procedures: none Complications-Operative and Postpartum: none Hemoglobin  Date Value Range Status  01/29/2011 8.3* 12.0-15.0 (g/dL) Final     DELTA CHECK NOTED     REPEATED TO VERIFY     HCT  Date Value Range Status  01/29/2011 25.8* 36.0-46.0 (%) Final    Discharge Diagnoses: Term Pregnancy-delivered [name] is a 35 y.o. [id number] at [redacted]w[redacted]d who delivered a healthy female by primary low transverse cesarean section secondary to failure to progress: arrest of descent. No complications.   Discharge Information: Date: 01/31/2011 Activity: pelvic rest Diet: routine Medications: PNV, Ibuprofen, Colace, Iron and Percocet Condition: stable Instructions: refer to practice specific booklet Discharge to: home Follow-up Information    Follow up with [facility] in 4 weeks.   Contact information:   [address] [place] [address]          Newborn Data: Live born female  Birth Weight: 7 lb 9.7 oz (3450 g) APGAR: 9, 10  Home with mother.  Rhonda Jackson 01/31/2011, 7:46 AM

## 2011-01-31 NOTE — Progress Notes (Signed)
Subjective: Postpartum Day 3: Cesarean Delivery Patient reports incisional pain, tolerating PO, + flatus, + BM and no problems voiding.    Objective: Vital signs in last 24 hours: Temp:  [97.9 F (36.6 C)-98.3 F (36.8 C)] 97.9 F (36.6 C) (12/07 0540) Pulse Rate:  [85-99] 99  (12/07 0540) Resp:  [18-20] 18  (12/07 0540) BP: (119-128)/(77-84) 119/77 mmHg (12/07 0540)  Physical Exam:  General: alert, cooperative and no distress Lochia: appropriate Uterine Fundus: firm Incision: healing well, no significant drainage, no dehiscence DVT Evaluation: No cords or calf tenderness. Calf/Ankle edema is present 2+ pitting   Basename 01/29/11 0523  HGB 8.3*  HCT 25.8*    Assessment/Plan: Status post Cesarean section. Doing well postoperatively.  Discharge home with standard precautions and return to clinic in 4-6 weeks.  Plans for BTL (medicaid pending), breastfeeding.   HUNTER, STEPHEN 01/31/2011, 7:22 AM

## 2011-02-01 ENCOUNTER — Inpatient Hospital Stay (HOSPITAL_COMMUNITY): Admission: RE | Admit: 2011-02-01 | Payer: Self-pay | Source: Ambulatory Visit

## 2011-02-02 NOTE — Discharge Summary (Signed)
Chart reviewed and agree with management and plan.  

## 2011-02-05 NOTE — Op Note (Signed)
Baby was occiput posterior Agree with above note.  Jyden Kromer H. 02/05/2011 12:44 PM

## 2011-02-27 ENCOUNTER — Ambulatory Visit (INDEPENDENT_AMBULATORY_CARE_PROVIDER_SITE_OTHER): Payer: Self-pay | Admitting: Family

## 2011-02-27 LAB — HEMOGLOBIN: Hemoglobin: 11.1 g/dL — ABNORMAL LOW (ref 12.0–15.0)

## 2011-02-27 MED ORDER — OXYCODONE-ACETAMINOPHEN 5-325 MG PO TABS: 1.0000 | ORAL_TABLET | ORAL | Status: AC | PRN

## 2011-02-27 NOTE — Progress Notes (Signed)
  Subjective:     Rhonda Jackson is a 36 y.o. female who presents for a postpartum visit. She is 4 weeks postpartum following a low cervical transverse Cesarean section. I have fully reviewed the prenatal and intrapartum course. The delivery was at 41 gestational weeks. Outcome: primary cesarean section, low transverse incision. Anesthesia: epidural. Postpartum course has been up until 4 days ago, reports increased incisional pain. Reports increasing activity at home because she felt better.  Baby's course has been uncomplicated. Baby is feeding by breast. Bleeding no bleeding. Bowel function is normal. Bladder function is normal. Patient is not sexually active. Contraception method is none. Postpartum depression screening: negative.   Review of Systems Pertinent items are noted in HPI.   Objective:    BP 132/79  Pulse 88  Temp(Src) 96.8 F (36 C) (Oral)  Ht 5\' 2"  (1.575 m)  Wt 202 lb 14.4 oz (92.035 kg)  BMI 37.11 kg/m2  Breastfeeding? Yes  General:  alert, cooperative and appears stated age   Breasts:  inspection negative, no nipple discharge or bleeding, no masses or nodularity palpable  Lungs: clear to auscultation bilaterally  Heart:  regular rate and rhythm, S1, S2 normal, no murmur, click, rub or gallop  Abdomen: soft, non-tender; bowel sounds normal; no masses,  no organomegaly and incision site healing well, well approximated, slight redness, no abnormal discharge, fungal type odor.     Vulva:  normal  Vagina: normal vagina  Cervix:  no cervical motion tenderness  Corpus: normal size, contour, position, consistency, mobility, non-tender  Adnexa:  normal adnexa  Rectal Exam: Normal rectovaginal exam        Assessment:    Normal postpartum exam.  Incisional Pain  Plan:    1. Contraception: condoms 2. Percocet #20; decrease activity; listen to body 3.  Wash incisional site well; ensure dry; may use topical antifungal; let air dry. 3. Follow up in: 2 weeks or as needed.

## 2011-02-27 NOTE — Progress Notes (Signed)
Addended by: Lynnell Dike on: 02/27/2011 02:25 PM   Modules accepted: Orders

## 2011-02-27 NOTE — Patient Instructions (Signed)
  Place postpartum visit patient instructions here.  

## 2013-01-07 IMAGING — US US OB COMP +14 WK
1 of 2 series · 12 of 28 positions shown · non-contrast
Comparison: none

[Series 1: us ob comp +14 wk · 12 of 50 slices shown]
[im 1/50]
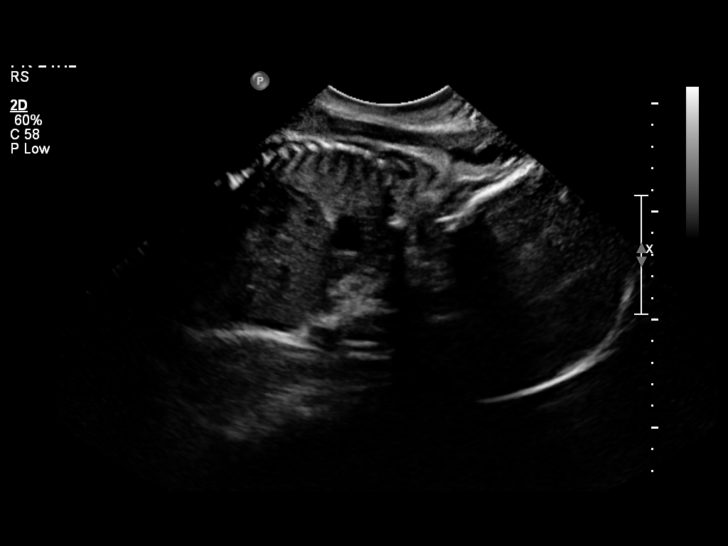
[im 4/50]
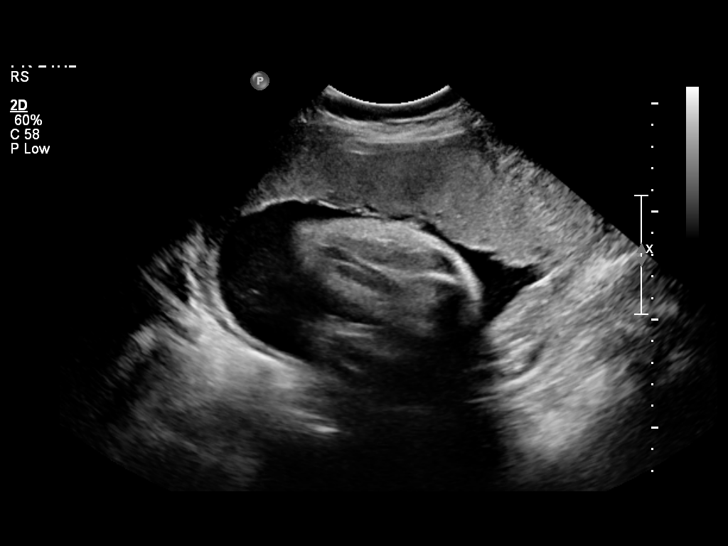
[im 8/50]
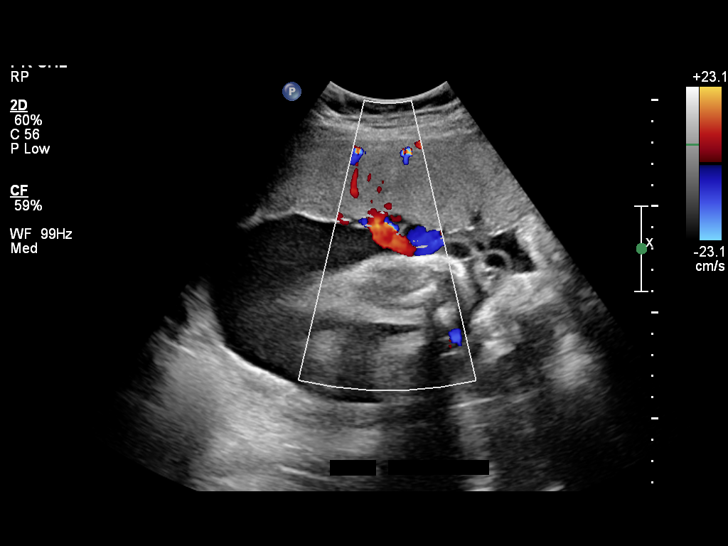
[im 14/50]
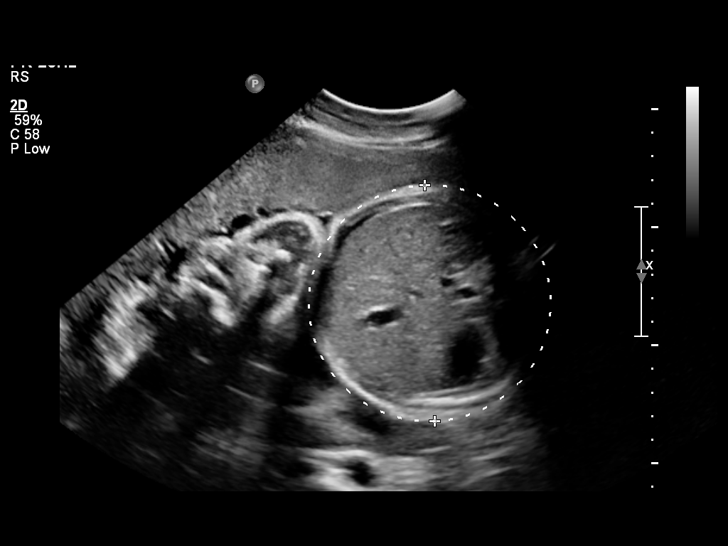
[im 17/50]
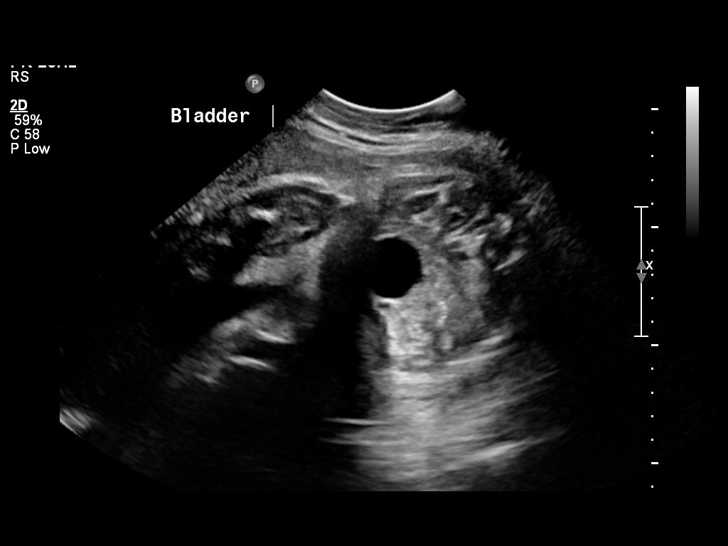
[im 21/50]
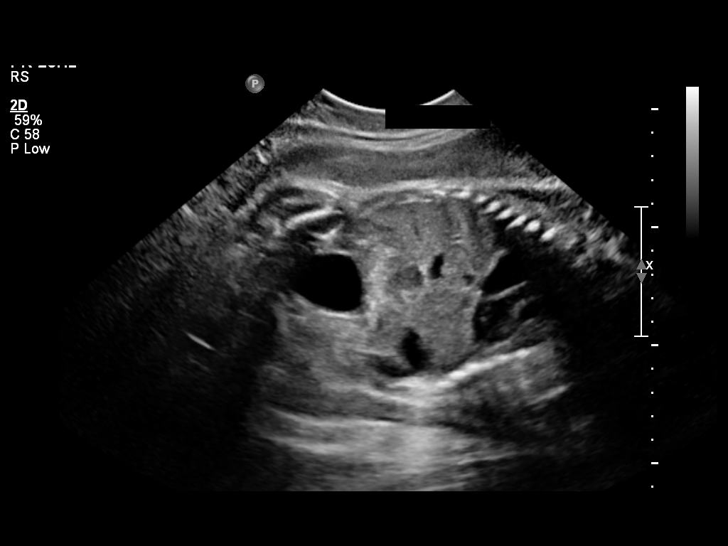
[im 27/50]
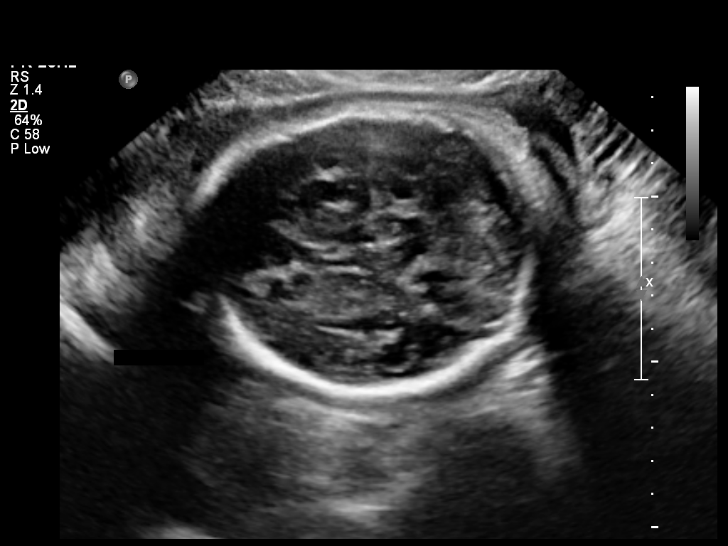
[im 31/50]
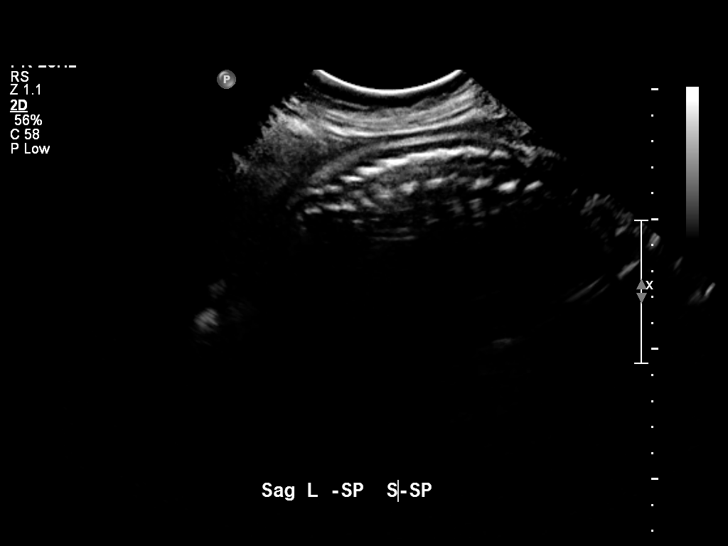
[im 34/50]
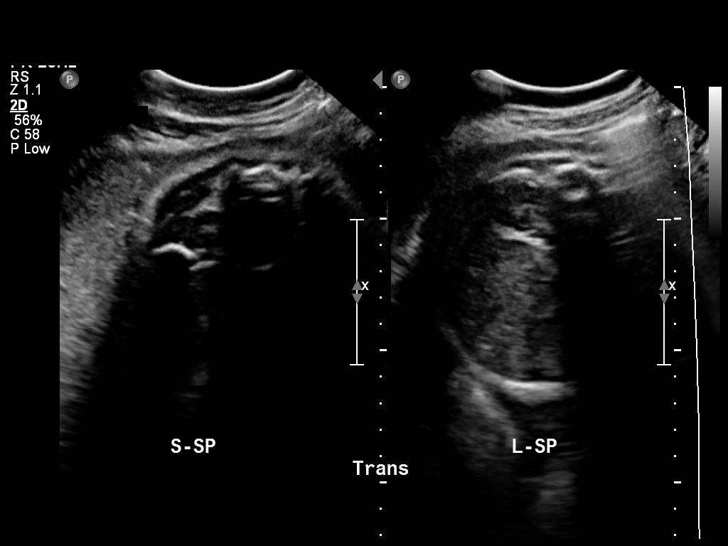
[im 40/50]
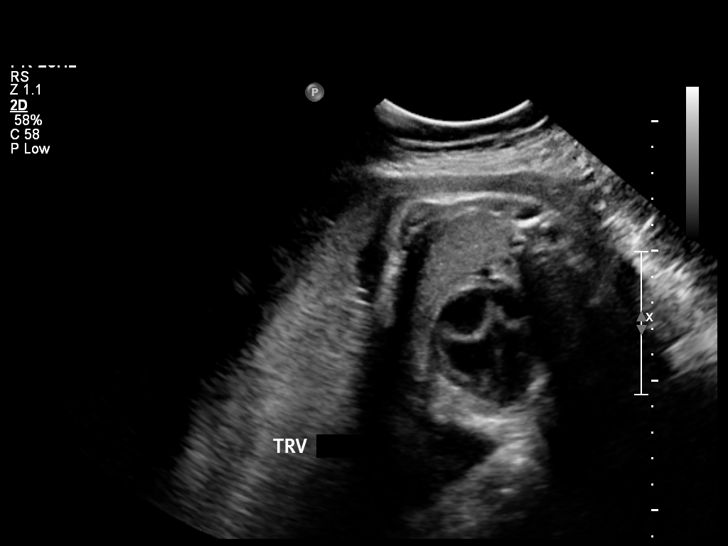
[im 44/50]
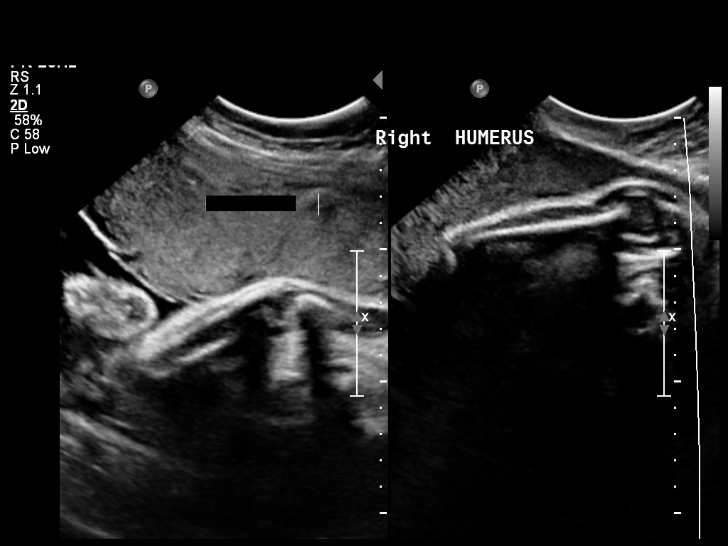
[im 48/50]
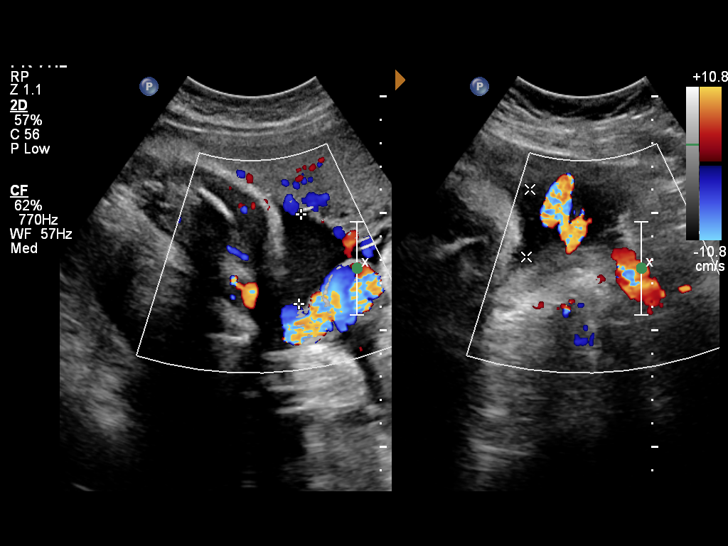

[12 of 28 positions shown; findings below may reference images not displayed]

OBSTETRICS REPORT
                      (Signed Final 12/18/2010 [DATE])

Procedures

 US OB COMP + 14 WK                                    76805.1
Indications

 Routine fetal anatomic survey
 Vaginal bleeding, unknown etiology
 Assess amniotic fluid and placenta
Fetal Evaluation

 Fetal Heart Rate:  127                          bpm
 Cardiac Activity:  Observed
 Presentation:      Cephalic
 Placenta:          Anterior, above cervical os
 P. Cord            Visualized
 Insertion:

 Comment:    No placental abruption or previa identified.

 Amniotic Fluid
 AFI FV:      Subjectively within normal limits
 AFI Sum:     16.06   cm       59  %Tile     Larg Pckt:    6.33  cm
 RUQ:   6.33    cm   RLQ:    3.82   cm    LUQ:   2.88    cm   LLQ:    3.03   cm
Biometry

 BPD:       87  mm     G. Age:  35w 1d                CI:        72.57   70 - 86
                                                      FL/HC:      21.6   20.1 -

 HC:     324.8  mm     G. Age:  36w 5d       51  %    HC/AC:      1.02   0.93 -

 AC:     316.9  mm     G. Age:  35w 4d       63  %    FL/BPD:     80.7   71 - 87
 FL:      70.2  mm     G. Age:  36w 0d       59  %    FL/AC:      22.2   20 - 24

 Est. FW:    1292  gm      6 lb 2 oz     70  %
Gestational Age

 LMP:           35w 3d        Date:  04/14/10                 EDD:   01/19/11
 U/S Today:     35w 6d                                        EDD:   01/16/11
 Best:          35w 3d     Det. By:  LMP  (04/14/10)          EDD:   01/19/11
Anatomy

 Cranium:           Appears normal      Aortic Arch:       Not well
                                                           visualized
 Fetal Cavum:       Appears normal      Ductal Arch:       Not well
                                                           visualized
 Ventricles:        Appears normal      Diaphragm:         Appears normal
 Choroid Plexus:    Appears normal      Stomach:           Appears
                                                           normal, left
                                                           sided
 Cerebellum:        Appears normal      Abdomen:           Appears normal
 Posterior Fossa:   Appears normal      Abdominal Wall:    Appears nml
                                                           (cord insert,
                                                           abd wall)
 Nuchal Fold:       Not applicable      Cord Vessels:      Appears normal
                    (>20 wks GA)                           (3 vessel cord)
 Face:              Lips appear         Kidneys:           Appear normal
                    normal
 Heart:             Appears normal      Bladder:           Appears normal
                    (4 chamber &
                    axis)
 RVOT:              Not well            Spine:             Appears normal
                    visualized
 LVOT:              Not well            Limbs:             Four extremities
                    visualized                             seen

 Other:     Female gender. Technically difficult due to advanced GA
            and fetal position.
Cervix Uterus Adnexa

 Cervix:       Not visualized (advanced GA >34 wks)

 Adnexa:     No abnormality visualized.
Impression

 Single living IUP.  US EGA is concordant with LMP.
 No fetal anomalies seen involving visualized anatomy.
 Amniotic fluid within normal limits, with AFI of 16.06 cm.
 No placental abruption or previa identifed.

 [REDACTED] with us.  Please do not hesitate to contact

## 2013-01-10 ENCOUNTER — Encounter (HOSPITAL_COMMUNITY): Payer: Self-pay | Admitting: Emergency Medicine

## 2013-01-10 ENCOUNTER — Emergency Department (HOSPITAL_COMMUNITY)
Admission: EM | Admit: 2013-01-10 | Discharge: 2013-01-10 | Disposition: A | Discharge status: Home / Self Care | Payer: Medicaid Other | Attending: Emergency Medicine | Admitting: Emergency Medicine

## 2013-01-10 DIAGNOSIS — Z888 Allergy status to other drugs, medicaments and biological substances status: Secondary | ICD-10-CM | POA: Insufficient documentation

## 2013-01-10 DIAGNOSIS — R05 Cough: Secondary | ICD-10-CM | POA: Insufficient documentation

## 2013-01-10 DIAGNOSIS — B9789 Other viral agents as the cause of diseases classified elsewhere: Secondary | ICD-10-CM | POA: Insufficient documentation

## 2013-01-10 DIAGNOSIS — Z8619 Personal history of other infectious and parasitic diseases: Secondary | ICD-10-CM | POA: Insufficient documentation

## 2013-01-10 DIAGNOSIS — J029 Acute pharyngitis, unspecified: Secondary | ICD-10-CM | POA: Insufficient documentation

## 2013-01-10 DIAGNOSIS — Z8742 Personal history of other diseases of the female genital tract: Secondary | ICD-10-CM | POA: Insufficient documentation

## 2013-01-10 DIAGNOSIS — R059 Cough, unspecified: Secondary | ICD-10-CM | POA: Insufficient documentation

## 2013-01-10 DIAGNOSIS — J309 Allergic rhinitis, unspecified: Secondary | ICD-10-CM | POA: Insufficient documentation

## 2013-01-10 DIAGNOSIS — B349 Viral infection, unspecified: Secondary | ICD-10-CM

## 2013-01-10 DIAGNOSIS — Z8611 Personal history of tuberculosis: Secondary | ICD-10-CM | POA: Insufficient documentation

## 2013-01-10 DIAGNOSIS — E669 Obesity, unspecified: Secondary | ICD-10-CM | POA: Insufficient documentation

## 2013-01-10 HISTORY — DX: Reserved for inherently not codable concepts without codable children: IMO0001

## 2013-01-10 HISTORY — DX: Complete or unspecified spontaneous abortion without complication: O03.9

## 2013-01-10 LAB — RAPID STREP SCREEN (MED CTR MEBANE ONLY): Streptococcus, Group A Screen (Direct): NEGATIVE

## 2013-01-10 MED ORDER — DEXAMETHASONE SODIUM PHOSPHATE 10 MG/ML IJ SOLN
10.0000 mg | Freq: Once | INTRAMUSCULAR | Status: AC
  Administered 2013-01-10: 10 mg via INTRAVENOUS
  Filled 2013-01-10: qty 1

## 2013-01-10 MED ORDER — KETOROLAC TROMETHAMINE 60 MG/2ML IM SOLN
60.0000 mg | Freq: Once | INTRAMUSCULAR | Status: AC
  Administered 2013-01-10: 60 mg via INTRAMUSCULAR
  Filled 2013-01-10: qty 2

## 2013-01-10 NOTE — ED Provider Notes (Signed)
CSN: [id number]     Arrival date & time 01/10/13  2053 History   First MD Initiated Contact with Patient 01/10/13 2119     Chief Complaint  Patient presents with  . Fever   (Consider location/radiation/quality/duration/timing/severity/associated sxs/prior Treatment) HPI Here with fever, chills, sore throat, nasal drainage, cough. Onset was this morning, constant, worsening.  The pain is moderate, located to throat. Modifying factors: worse with swallowing.  Associated symptoms: no syncope, no emesis, no dysuria.  Recent medical care: none.   Past Medical History  Diagnosis Date  . Abnormal Pap smear   . Tuberculosis     hx +PPD  . History of chlamydia   . Obese   . AMA (advanced maternal age) multigravida 35+   . Abortion    Past Surgical History  Procedure Laterality Date  . Dilation and curettage of uterus      tab x2  . Colposcopy    . Wisdom tooth extraction    . Cesarean section  01/28/2011    Procedure: CESAREAN SECTION;  Surgeon: [name], MD;  Location: [facility];  Service: Gynecology;  Laterality: N/A;   Family History  Problem Relation Age of Onset  . Cancer Mother     breast cancer  . Osteoporosis Mother   . Gout Father   . Sleep apnea Father   . Emphysema Father   . Anesthesia problems Neg Hx   . Hypotension Neg Hx   . Malignant hyperthermia Neg Hx   . Pseudochol deficiency Neg Hx    History  Substance Use Topics  . Smoking status: Never Smoker   . Smokeless tobacco: Never Used  . Alcohol Use: No   OB History   Grav Para Term Preterm Abortions TAB SAB Ect Mult Living   7 3 3  4 3  0 1  3     Review of Systems Constitutional: Negative for weight loss.  Eyes: Negative for vision loss.  ENT: Negative for difficulty swallowing.  Cardiovascular: Negative for chest pain. Respiratory: Negative for respiratory distress.  Gastrointestinal:  Negative for vomiting.  Genitourinary: Negative for inability to void.  Musculoskeletal: Negative for gait  problem.  Integumentary: Negative for rash.  Neurological: Negative for new focal weakness.     Allergies  Naproxen  Home Medications  No current outpatient prescriptions on file. BP 146/96  Pulse 84  Temp(Src) 97.9 F (36.6 C) (Oral)  Resp 16  Ht 5\' 2"  (1.575 m)  Wt 222 lb 9.6 oz (100.971 kg)  BMI 40.70 kg/m2  SpO2 98%  LMP 01/10/2013 Physical Exam Nursing note and vitals reviewed.  Constitutional: Pt is alert and appears stated age. Eyes: No injection, no scleral icterus. HENT: Atraumatic, airway open without erythema or exudate.  Neck: Mild bilateral lymphadenopathy.  Respiratory: No respiratory distress. Equal breathing bilaterally. Cardiovascular: Normal rate. Extremities warm and well perfused.  Abdomen: Soft, non-tender. MSK: Extremities are atraumatic without deformity. Skin: No rash, no wounds.   Neuro: No motor nor sensory deficit. GCS 15.      ED Course  Procedures (including critical care time) Labs Review Labs Reviewed  RAPID STREP SCREEN  CULTURE, GROUP A STREP   Imaging Review No results found.  EKG Interpretation   None       MDM   1. Viral syndrome    37 y.o. female presents w/ likely viral syndrome. Looks well, non-toxic. No signs of PTA or RPA. Not c/w PNA. Pt denies possibility of pregnancy. Strep screen neg. Pt given toradol,  decadron. Counseling provided regarding diagnosis, treatment plan, follow up recommendations, and return precautions. Questions answered.       I independently viewed, interpreted, and used in my medical decision making all ordered lab and imaging tests. Medical Decision Making discussed with ED attending Enid Skeens, MD      Charm Barges, MD 01/10/13 [phone]

## 2013-01-10 NOTE — ED Notes (Signed)
MD at bedside. 

## 2013-01-10 NOTE — ED Notes (Signed)
Pt. woke up this morning with fever , chills and bilateral neck pain denies injury , airway intact / respirations unlabored .

## 2013-01-11 NOTE — ED Provider Notes (Signed)
Medical screening examination/treatment/procedure(s) were conducted as a shared visit with non-physician practitioner(s) or resident  and myself.  I personally evaluated the patient during the encounter and agree with the findings and plan unless otherwise indicated.    I have personally reviewed any xrays and/ or EKG's with the provider and I agree with interpretation.   Cough, sore throat.  Pt healthy, well appearing.  No trismus, uvular deviation, unilateral posterior pharyngeal edema or submandibular swelling. Lungs clear.  Supportive care.  Pharyngitis, Cough, viral syndrome  Enid Skeens, MD 01/11/13 [phone]

## 2013-01-12 LAB — CULTURE, GROUP A STREP

## 2013-10-01 ENCOUNTER — Emergency Department (HOSPITAL_COMMUNITY)
Admission: EM | Admit: 2013-10-01 | Discharge: 2013-10-01 | Disposition: A | Discharge status: Home / Self Care | Payer: No Typology Code available for payment source | Attending: Emergency Medicine | Admitting: Emergency Medicine

## 2013-10-01 ENCOUNTER — Encounter (HOSPITAL_COMMUNITY): Payer: Self-pay | Admitting: Emergency Medicine

## 2013-10-01 DIAGNOSIS — Z8619 Personal history of other infectious and parasitic diseases: Secondary | ICD-10-CM | POA: Diagnosis not present

## 2013-10-01 DIAGNOSIS — M2540 Effusion, unspecified joint: Secondary | ICD-10-CM | POA: Diagnosis not present

## 2013-10-01 DIAGNOSIS — Z3202 Encounter for pregnancy test, result negative: Secondary | ICD-10-CM | POA: Insufficient documentation

## 2013-10-01 DIAGNOSIS — R109 Unspecified abdominal pain: Secondary | ICD-10-CM | POA: Insufficient documentation

## 2013-10-01 DIAGNOSIS — E669 Obesity, unspecified: Secondary | ICD-10-CM | POA: Diagnosis not present

## 2013-10-01 DIAGNOSIS — Z8739 Personal history of other diseases of the musculoskeletal system and connective tissue: Secondary | ICD-10-CM

## 2013-10-01 LAB — COMPREHENSIVE METABOLIC PANEL
ALT: 16 U/L (ref 0–35)
ANION GAP: 15 (ref 5–15)
AST: 16 U/L (ref 0–37)
Albumin: 3.9 g/dL (ref 3.5–5.2)
Alkaline Phosphatase: 61 U/L (ref 39–117)
BILIRUBIN TOTAL: 0.2 mg/dL — AB (ref 0.3–1.2)
BUN: 12 mg/dL (ref 6–23)
CHLORIDE: 102 meq/L (ref 96–112)
CO2: 22 meq/L (ref 19–32)
CREATININE: 0.67 mg/dL (ref 0.50–1.10)
Calcium: 9.2 mg/dL (ref 8.4–10.5)
GFR calc Af Amer: >90 mL/min (ref >90)
Glucose, Bld: 88 mg/dL (ref 70–99)
Potassium: 3.9 mEq/L (ref 3.7–5.3)
Sodium: 139 mEq/L (ref 137–147)
Total Protein: 7.3 g/dL (ref 6.0–8.3)

## 2013-10-01 LAB — CBC WITH DIFFERENTIAL/PLATELET
Basophils Absolute: 0 10*3/uL (ref 0.0–0.1)
Basophils Relative: 0 % (ref 0–1)
Eosinophils Absolute: 0.2 10*3/uL (ref 0.0–0.7)
Eosinophils Relative: 2 % (ref 0–5)
HEMATOCRIT: 37.9 % (ref 36.0–46.0)
HEMOGLOBIN: 12.2 g/dL (ref 12.0–15.0)
LYMPHS PCT: 42 % (ref 12–46)
Lymphs Abs: 4 10*3/uL (ref 0.7–4.0)
MCH: 27.1 pg (ref 26.0–34.0)
MCHC: 32.2 g/dL (ref 30.0–36.0)
MCV: 84 fL (ref 78.0–100.0)
MONO ABS: 0.4 10*3/uL (ref 0.1–1.0)
MONOS PCT: 5 % (ref 3–12)
NEUTROS ABS: 4.9 10*3/uL (ref 1.7–7.7)
Neutrophils Relative %: 51 % (ref 43–77)
Platelets: 252 10*3/uL (ref 150–400)
RBC: 4.51 MIL/uL (ref 3.87–5.11)
RDW: 14 % (ref 11.5–15.5)
WBC: 9.6 10*3/uL (ref 4.0–10.5)

## 2013-10-01 LAB — URINALYSIS, ROUTINE W REFLEX MICROSCOPIC
BILIRUBIN URINE: NEGATIVE
GLUCOSE, UA: NEGATIVE mg/dL
Ketones, ur: NEGATIVE mg/dL
Leukocytes, UA: NEGATIVE
Nitrite: NEGATIVE
Protein, ur: NEGATIVE mg/dL
SPECIFIC GRAVITY, URINE: 1.02 (ref 1.005–1.030)
UROBILINOGEN UA: 0.2 mg/dL (ref 0.0–1.0)
pH: 5 (ref 5.0–8.0)

## 2013-10-01 LAB — URINE MICROSCOPIC-ADD ON

## 2013-10-01 LAB — LIPASE, BLOOD: LIPASE: 53 U/L (ref 11–59)

## 2013-10-01 LAB — PREGNANCY, URINE: Preg Test, Ur: NEGATIVE

## 2013-10-01 MED ORDER — DICYCLOMINE HCL 20 MG PO TABS: 20.0000 mg | ORAL_TABLET | Freq: Two times a day (BID) | ORAL | Status: DC

## 2013-10-01 NOTE — ED Notes (Addendum)
Generalized swelling all over. States, "knees hurt when sitting." also, c/o stomach being swollen. No nausea.

## 2013-10-01 NOTE — ED Provider Notes (Signed)
CSN: [id number]     Arrival date & time 10/01/13  1949 History   First MD Initiated Contact with Patient 10/01/13 2126     Chief Complaint  Patient presents with  . Joint Swelling     (Consider location/radiation/quality/duration/timing/severity/associated sxs/prior Treatment) HPI Comments: The patient is a 38 year old female past no history of obesity, presenting to emergency room chief complaint of intermittent joint swelling only for 6 months and abdominal bloating for several days. The patient reports bilateral ankle swelling for several days, similar to past. She reports increased discomfort with standing for long periods of time. Denies history of trauma, redness. Just complains of right elbow swelling denies injury. Reports seeing reflexologist for similar complaints in the past. She reports generalized abdominal bloating denies vomiting, diarrhea, constipation. No aggrivating or relieving factors. Reports seeing a holistic doctor for similar complaints in the past, prescribed herbal medication, advised to avoid gluten. LNMP 09/18/2013.   The history is provided by the patient. No language interpreter was used.    Past Medical History  Diagnosis Date  . Abnormal Pap smear   . Tuberculosis     hx +PPD  . History of chlamydia   . Obese   . AMA (advanced maternal age) multigravida 35+   . Abortion    Past Surgical History  Procedure Laterality Date  . Dilation and curettage of uterus      tab x2  . Colposcopy    . Wisdom tooth extraction    . Cesarean section  01/28/2011    Procedure: CESAREAN SECTION;  Surgeon: Lesly DukesKelly H. Leggett, MD;  Location: [facility];  Service: Gynecology;  Laterality: N/A;   Family History  Problem Relation Age of Onset  . Cancer Mother     breast cancer  . Osteoporosis Mother   . Gout Father   . Sleep apnea Father   . Emphysema Father   . Anesthesia problems Neg Hx   . Hypotension Neg Hx   . Malignant hyperthermia Neg Hx   . Pseudochol deficiency Neg  Hx    History  Substance Use Topics  . Smoking status: Never Smoker   . Smokeless tobacco: Never Used  . Alcohol Use: No   OB History   Grav Para Term Preterm Abortions TAB SAB Ect Mult Living   7 3 3  4 3  0 1  3     Review of Systems  Constitutional: Negative for fever and chills.  Gastrointestinal: Negative for vomiting, abdominal pain, diarrhea and constipation.  Genitourinary: Negative for dysuria, urgency, hematuria, vaginal bleeding and vaginal discharge.  Musculoskeletal: Positive for joint swelling.      Allergies  Naproxen  Home Medications   Prior to Admission medications   Not on File   BP 135/69  Pulse 80  Temp(Src) 97.8 F (36.6 C) (Oral)  Resp 12  Ht 5\' 2"  (1.575 m)  Wt 200 lb (90.719 kg)  BMI 36.57 kg/m2  SpO2 100% Physical Exam  Nursing note and vitals reviewed. Constitutional: She is oriented to person, place, and time. She appears well-developed and well-nourished.  Non-toxic appearance. She does not have a sickly appearance. She does not appear ill. No distress.  HENT:  Head: Normocephalic and atraumatic.  Eyes: EOM are normal. Pupils are equal, round, and reactive to light.  Neck: Normal range of motion. Neck supple.  Cardiovascular: Normal rate and regular rhythm.   Pulmonary/Chest: Effort normal and breath sounds normal. No respiratory distress. She has no wheezes. She has no rales.  Abdominal:  Soft. She exhibits no mass. There is tenderness. There is no rebound and no guarding.  Obese abdomen. Minimal generalized tenderness with deep palpation.  Musculoskeletal: Normal range of motion.       Right elbow: She exhibits normal range of motion and no swelling.       Left elbow: Normal. She exhibits normal range of motion and no swelling.       Right ankle: She exhibits normal range of motion, no swelling, no deformity and normal pulse. No tenderness.       Left ankle: She exhibits normal range of motion, no swelling, no deformity and normal  pulse. No tenderness.  Neurological: She is alert and oriented to person, place, and time.  Skin: Skin is warm and dry. She is not diaphoretic.  Psychiatric: She has a normal mood and affect. Her behavior is normal.    ED Course  Procedures (including critical care time) Labs Review Labs Reviewed - No data to display  Results for orders placed during the hospital encounter of 10/01/13  CBC WITH DIFFERENTIAL      Result Value Ref Range   WBC 9.6  4.0 - 10.5 K/uL   RBC 4.51  3.87 - 5.11 MIL/uL   Hemoglobin 12.2  12.0 - 15.0 g/dL   HCT 11.9  14.7 - 82.9 %   MCV 84.0  78.0 - 100.0 fL   MCH 27.1  26.0 - 34.0 pg   MCHC 32.2  30.0 - 36.0 g/dL   RDW 56.2  13.0 - 86.5 %   Platelets 252  150 - 400 K/uL   Neutrophils Relative % 51  43 - 77 %   Neutro Abs 4.9  1.7 - 7.7 K/uL   Lymphocytes Relative 42  12 - 46 %   Lymphs Abs 4.0  0.7 - 4.0 K/uL   Monocytes Relative 5  3 - 12 %   Monocytes Absolute 0.4  0.1 - 1.0 K/uL   Eosinophils Relative 2  0 - 5 %   Eosinophils Absolute 0.2  0.0 - 0.7 K/uL   Basophils Relative 0  0 - 1 %   Basophils Absolute 0.0  0.0 - 0.1 K/uL  COMPREHENSIVE METABOLIC PANEL      Result Value Ref Range   Sodium 139  137 - 147 mEq/L   Potassium 3.9  3.7 - 5.3 mEq/L   Chloride 102  96 - 112 mEq/L   CO2 22  19 - 32 mEq/L   Glucose, Bld 88  70 - 99 mg/dL   BUN 12  6 - 23 mg/dL   Creatinine, Ser 7.84  0.50 - 1.10 mg/dL   Calcium 9.2  8.4 - 69.6 mg/dL   Total Protein 7.3  6.0 - 8.3 g/dL   Albumin 3.9  3.5 - 5.2 g/dL   AST 16  0 - 37 U/L   ALT 16  0 - 35 U/L   Alkaline Phosphatase 61  39 - 117 U/L   Total Bilirubin 0.2 (*) 0.3 - 1.2 mg/dL   GFR calc non Af Amer >90  >90 mL/min   GFR calc Af Amer >90  >90 mL/min   Anion gap 15  5 - 15  LIPASE, BLOOD      Result Value Ref Range   Lipase 53  11 - 59 U/L  PREGNANCY, URINE      Result Value Ref Range   Preg Test, Ur NEGATIVE  NEGATIVE  URINALYSIS, ROUTINE W REFLEX MICROSCOPIC  Result Value Ref Range   Color,  Urine YELLOW  YELLOW   APPearance CLOUDY (*) CLEAR   Specific Gravity, Urine 1.020  1.005 - 1.030   pH 5.0  5.0 - 8.0   Glucose, UA NEGATIVE  NEGATIVE mg/dL   Hgb urine dipstick MODERATE (*) NEGATIVE   Bilirubin Urine NEGATIVE  NEGATIVE   Ketones, ur NEGATIVE  NEGATIVE mg/dL   Protein, ur NEGATIVE  NEGATIVE mg/dL   Urobilinogen, UA 0.2  0.0 - 1.0 mg/dL   Nitrite NEGATIVE  NEGATIVE   Leukocytes, UA NEGATIVE  NEGATIVE  URINE MICROSCOPIC-ADD ON      Result Value Ref Range   Squamous Epithelial / LPF MANY (*) RARE   WBC, UA 0-2  <3 WBC/hpf   RBC / HPF 3-6  <3 RBC/hpf   Bacteria, UA MANY (*) RARE   Crystals CA OXALATE CRYSTALS (*) NEGATIVE   No results found.   Imaging Review No results found.   EKG Interpretation None      MDM   Final diagnoses:  Abdominal discomfort  History of joint swelling   Patient presents with multiple complaints ongoing for several months. Patient is afebrile, in no acute distress No obvious deformity or injury, concern for infected joint, to bilateral ankles and right elbow. Patient hasn't minimal diffuse abdominal tenderness.  No acute abdomen. Labs ordered. Labs without concerning abnormalities.  Don't feel the patient needs further testing for her multiple complaints at this time. Reevaluation patient resting comfortably in room.Discussed lab results, and treatment plan with the patient. Advised patient and she has persistent joint discomfort to followup with a rheumatologist. Return precautions given. Reports understanding and no other concerns at this time.  Patient is stable for discharge at this time.  Meds given in ED:  Medications - No data to display  Discharge Medication List as of 10/01/2013 10:36 PM    START taking these medications   Details  dicyclomine (BENTYL) 20 MG tablet Take 1 tablet (20 mg total) by mouth 2 (two) times daily., Starting 10/01/2013, Until Discontinued, Print            Mellody Drown, PA-C 10/02/13 [phone]

## 2013-10-01 NOTE — Discharge Instructions (Signed)
Call for a follow up appointment with a Family or Primary Care Provider.  °Return if Symptoms worsen.   °Take medication as prescribed.  ° ° °Emergency Department Resource Guide °1) Find a Doctor and Pay Out of Pocket °Although you won't have to find out who is covered by your insurance plan, it is a good idea to ask around and get recommendations. You will then need to call the office and see if the doctor you have chosen will accept you as a new patient and what types of options they offer for patients who are self-pay. Some doctors offer discounts or will set up payment plans for their patients who do not have insurance, but you will need to ask so you aren't surprised when you get to your appointment. ° °2) Contact Your Local Health Department °Not all health departments have doctors that can see patients for sick visits, but many do, so it is worth a call to see if yours does. If you don't know where your local health department is, you can check in your phone book. The CDC also has a tool to help you locate your state's health department, and many state websites also have listings of all of their local health departments. ° °3) Find a Walk-in Clinic °If your illness is not likely to be very severe or complicated, you may want to try a walk in clinic. These are popping up all over the country in pharmacies, drugstores, and shopping centers. They're usually staffed by nurse practitioners or physician assistants that have been trained to treat common illnesses and complaints. They're usually fairly quick and inexpensive. However, if you have serious medical issues or chronic medical problems, these are probably not your best option. ° °No Primary Care Doctor: °- Call [facility] at  832-8000 - they can help you locate a primary care doctor that  accepts your insurance, provides certain services, etc. °- Physician Referral Service- 1-800-533-3463 ° °Chronic Pain Problems: °Organization         Address  Phone    Notes  °[facility]  (336) 297-2271 Patients need to be referred by their primary care doctor.  ° °Medication Assistance: °Organization         Address  Phone   Notes  °[place] [facility] [address] °[place], [place] [address] (336) 641-8030 --Must be a resident of [place] °-- Must have NO insurance coverage whatsoever (no Medicaid/ Medicare, etc.) °-- The pt. MUST have a primary care doctor that directs their care regularly and follows them in the community °  °[facility]  (866) 331-1348   °[employer]  (888) 892-1162   ° °Agencies that provide inexpensive medical care: °Organization         Address  Phone   Notes  °[facility]  (336) 832-8035   °[facility]    (336) 832-7272   °[facility] [address] °[place], [place] [address] (336) 832-4777   °[facility] [address], °[place] (336) 271-4999   °[facility]    (336) 373-0678   °[facility]    (336) 272-1050   °[facility] ° [address], [place] Phone:  (336) 832-4444, Fax:  (336) 832-4440 Hours of Operation:  9 am - 6 pm, M-F.  Also accepts Medicaid/Medicare and self-pay.  °[facility] ° [address], [place]   Phone: (336) 832-3150, Fax: (336) 832-3151. Hours of Operation:  8:30 am - 5:30 pm, M-F.  Also accepts Medicaid and self-pay.  °[facility] [place] [address], [place] Phone: (336) 878-6027   °[facility] [address], [place], [place] (336)723-1848, Ext. 123 Mondays & Thursdays: 7-9 AM.  First 15 patients are seen on a first come, first serve basis. °  ° °Medicaid-accepting [place] Providers: ° °Organization         Address  Phone   Notes  °[facility] [address], [place] (336) 641-2100 Also accepts self-pay patients.  °[facility]  [address], [place] ° (336) 856-9996   °[facility] [address], [place] (336) 288-8857   °[facility] [address] [place] [address], [place] (336) 299-7000   °Veita Bland [address], [place]  ° (336) 373-1557 Only accepts [place] Access Medicaid patients after they have their name applied to their card.  ° °Self-Pay (no insurance) in [place]: ° °Organization         Address  Phone   Notes  °Sickle Cell Patients, [facility] [address], [place] (336) 832-1970   °[facility] [address], [place] (336) 832-4400   °[facility] ° [address], [place] (336) 992-4800   °[facility]/Dr. Osei-Bonsu ° [address] [place] [address], [place] or [address], [place] (336) 841-8500 Phone number for both [place] and [place] locations is the same.  °[facility] [address], [place] (336) 299-0000   °[facility] [address] [place] [address], [place] or [address] (336) 852-7530 °(336) 878-2260   °[facility] [address], [place] (336) 350-1642, phone; (336) 294-5005, fax Sees patients 1st and 3rd Saturday of every month.  Must not qualify for public or private insurance (i.e. Medicaid, Medicare, [place] Health Choice, Veterans' Benefits) • Household income should be no more than 200% of the poverty level •The clinic cannot treat you if you are pregnant or think you are pregnant • Sexually transmitted diseases are not treated at the clinic.  ° ° °Dental Care: °Organization         Address  Phone  Notes  °[facility] [address], [place] (336) 641-6152 Accepts children up to age 21 who are enrolled in Medicaid or [employer] Health Choice; pregnant women with a Medicaid card; and children who have  applied for Medicaid or [place] Health Choice, but were declined, whose parents can pay a reduced fee at time of service.  °[facility] [place]  [address], [place] (336) 641-7733 Accepts children up to age 21 who are enrolled in Medicaid or [place] Health Choice; pregnant women with a Medicaid card; and children who have applied for Medicaid or [place] Health Choice, but were declined, whose parents can pay a reduced fee at time of service.  °[facility] ° [address],  (336) 641-4533 Patients are seen by appointment only. Walk-ins are not accepted. [facility] will see patients 18 years of age and older. °Monday - Tuesday (8am-5pm) °Most Wednesdays (8:30-5pm) °$30 per visit, cash only  °[facility] ° [address], [place] (336) 641-4533 Patients are seen by appointment only. Walk-ins are not accepted. [facility] will see patients 18 years of age and older. °One Wednesday Evening (Monthly: Volunteer Based).  $30 per visit, cash only  °[facility]  (919) 537-3737 for adults; Children under age 4, call Graduate Pediatric Dentistry at (919) [phone]. Children aged 4-14, please call (919) [phone] to request a pediatric application. ° Dental services are provided in all areas of dental care including fillings, crowns and bridges, complete and partial dentures, implants, gum treatment, root canals, and extractions. Preventive care is also provided. Treatment is provided to both adults and children. °Patients are selected via a lottery and there is often a waiting list. °  °[facility] [address], °[place] ° (336) 763-8833 [email or web address] °  °[facility] [address], [place], [place] (336)723-1848, Ext. 123 Second and Fourth Thursday of each month, opens at 6:30 AM; Clinic ends at 9 AM.  Patients are seen on a first-come first-served basis, and a  limited number are seen during each clinic.  ° °[facility] ° [address], [place], [place] (336) 723-7904   Eligibility Requirements °You must have lived in [place], [place], or [place] counties for at least the last three months. °  You cannot be eligible for state or federal sponsored healthcare insurance, including Veterans Administration, Medicaid, or Medicare. °  You generally cannot be eligible for healthcare insurance through your employer.  °  How to apply: °Eligibility screenings are held every Tuesday and Wednesday afternoon from 1:00 pm until 4:00 pm. You do not need an appointment for the interview!  °[facility] [address], [place], [place] 336-631-2330   °[facility]  336-342-8273   °[facility]  336-703-3100   °[facility]  336-570-6415   ° °Behavioral Health Resources in the Community: °Intensive Outpatient Programs °Organization         Address  Phone  Notes  °[facility] [address], [place], [place] 336-878-6098   °[facility] [address], [place], [place] 336-832-9800   °[facility] [address], [place], [place] ° 336-882-2125   °[facility] [address],  °[place], [place] 1-800-853-5163 or 336-641-4981   °Substance Abuse Resources °Organization         Address  Phone  Notes  °[facility]  336-882-2125   °[facility]  336-784-9470   °[facility]  336-285-9073   °[facility]  336-845-3988   °Residential & Outpatient Substance Abuse Program  1-800-659-3381   °Psychological Services °Organization         Address  Phone  Notes  °[facility]  336- 832-9600   °[facility]  336- 378-7881   °[facility] [address], [place] 1-800-853-5163 or 336-641-4981   ° °Mobile Crisis Teams °Organization          Address  Phone  Notes  °[facility], Mobile Crisis Care Unit  1-877-626-1772   °Assertive °[facility] ° [address] [place], [place] 336-834-9664   °Sharon DeEsch [address] °[place] 336-554-5454   ° °Self-Help/Support Groups °Organization         Address  Phone             Notes  °[facility] - variety of   support groups  336- 373-1402 Call for more information  °Narcotics Anonymous (NA), [facility] [address], °[place]  2 meetings at this location  ° °Residential Treatment Programs °Organization         Address  Phone  Notes  °[facility] [address],    °[place]  1-866-801-8205   °[facility] ° [address], [place], [place] 704-293-8524   °[facility] [address], [place] 336-845-3988 Admissions: 8am-3pm M-F  °[facility] [address]    °[place], [place] 336-841-1104   °[facility] [address], [place], [place] 336-379-7146   °[facility] [address]  °[place], [place] 336-285-9073   °[facility] Intensive Outpatient [address], [place], [place] 336-852-3033   °[facility] [address]  °[place], [place] 1-877-615-2722 or 336-784-9470   °[facility] [address] [place], [place] 336-227-7417 Accepts Medicaid  °[facility] [address]  ° [place] 1-800-659-3381 Substance Abuse/Addiction Treatment  ° °[place] Behavioral Health Resources °Organization         Address  Phone  Notes  °[facility]  (888) 581-9988   °Julie Brannon, PhD [address] [place], [place]   (336) 349-5553 or (336) 951-0000   °[facility]   [address] °[place], [place] (336) 349-4454   °[facility] [address], [place], [place] (336) 342-8316 Insurance/Medicaid/sponsorship  through [facility]  °[facility] [address]                                    [place], [place] (336) 342-8316 Therapy/tele-psych/case  °[facility] [address]  ° [place], [place] (336) 349-2233    °Dr. Arfeen  (336) 349-4544   °[facility] [place]  [employer] [place] [facility] 1) [address], [place] °2) [address], [place] °3)  [address], [place] (336) 349-3220 °(336) 342-7768 ° °(336) 342-8140   °[place] [facility] (336) 342-1394 or (336) 342-3537 (After Hours)    ° °

## 2013-10-02 NOTE — ED Provider Notes (Signed)
Medical screening examination/treatment/procedure(s) were performed by non-physician practitioner and as supervising physician I was immediately available for consultation/collaboration.   EKG Interpretation None        Chasty Randal, MD 10/02/13 1234 

## 2013-10-06 ENCOUNTER — Other Ambulatory Visit: Payer: Self-pay | Admitting: Family Medicine

## 2013-10-06 DIAGNOSIS — Z803 Family history of malignant neoplasm of breast: Secondary | ICD-10-CM

## 2013-10-06 DIAGNOSIS — N6310 Unspecified lump in the right breast, unspecified quadrant: Secondary | ICD-10-CM

## 2013-10-18 ENCOUNTER — Ambulatory Visit
Admission: RE | Admit: 2013-10-18 | Discharge: 2013-10-18 | Disposition: A | Discharge status: Home / Self Care | Payer: No Typology Code available for payment source | Source: Ambulatory Visit | Attending: Family Medicine | Admitting: Family Medicine

## 2013-10-18 DIAGNOSIS — N6310 Unspecified lump in the right breast, unspecified quadrant: Secondary | ICD-10-CM

## 2013-10-18 DIAGNOSIS — Z803 Family history of malignant neoplasm of breast: Secondary | ICD-10-CM

## 2013-12-26 ENCOUNTER — Encounter (HOSPITAL_COMMUNITY): Payer: Self-pay | Admitting: Emergency Medicine

## 2014-10-24 ENCOUNTER — Emergency Department (INDEPENDENT_AMBULATORY_CARE_PROVIDER_SITE_OTHER)
Admission: EM | Admit: 2014-10-24 | Discharge: 2014-10-24 | Disposition: A | Discharge status: Home / Self Care | Payer: Self-pay | Source: Home / Self Care | Attending: Emergency Medicine | Admitting: Emergency Medicine

## 2014-10-24 ENCOUNTER — Encounter (HOSPITAL_COMMUNITY): Payer: Self-pay | Admitting: Emergency Medicine

## 2014-10-24 DIAGNOSIS — A084 Viral intestinal infection, unspecified: Secondary | ICD-10-CM

## 2014-10-24 MED ORDER — ONDANSETRON HCL 4 MG PO TABS: 4.0000 mg | ORAL_TABLET | Freq: Four times a day (QID) | ORAL | Status: DC

## 2014-10-24 NOTE — Discharge Instructions (Signed)
Viral Gastroenteritis Recommend using Pedialight initially. Small sips at a time. Zofran 4 mg by mouth as needed for nausea or vomiting Nothing for diarrhea at this point since it is slow down. No solid foods today. If needed for discomfort or fever may use Tylenol. No NSAIDs. Viral gastroenteritis is also known as stomach flu. This condition affects the stomach and intestinal tract. It can cause sudden diarrhea and vomiting. The illness typically lasts 3 to 8 days. Most people develop an immune response that eventually gets rid of the virus. While this natural response develops, the virus can make you quite ill. CAUSES  Many different viruses can cause gastroenteritis, such as rotavirus or noroviruses. You can catch one of these viruses by consuming contaminated food or water. You may also catch a virus by sharing utensils or other personal items with an infected person or by touching a contaminated surface. SYMPTOMS  The most common symptoms are diarrhea and vomiting. These problems can cause a severe loss of body fluids (dehydration) and a body salt (electrolyte) imbalance. Other symptoms may include:  Fever.  Headache.  Fatigue.  Abdominal pain. DIAGNOSIS  Your caregiver can usually diagnose viral gastroenteritis based on your symptoms and a physical exam. A stool sample may also be taken to test for the presence of viruses or other infections. TREATMENT  This illness typically goes away on its own. Treatments are aimed at rehydration. The most serious cases of viral gastroenteritis involve vomiting so severely that you are not able to keep fluids down. In these cases, fluids must be given through an intravenous line (IV). HOME CARE INSTRUCTIONS   Drink enough fluids to keep your urine clear or pale yellow. Drink small amounts of fluids frequently and increase the amounts as tolerated.  Ask your caregiver for specific rehydration instructions.  Avoid:  Foods high in  sugar.  Alcohol.  Carbonated drinks.  Tobacco.  Juice.  Caffeine drinks.  Extremely hot or cold fluids.  Fatty, greasy foods.  Too much intake of anything at one time.  Dairy products until 24 to 48 hours after diarrhea stops.  You may consume probiotics. Probiotics are active cultures of beneficial bacteria. They may lessen the amount and number of diarrheal stools in adults. Probiotics can be found in yogurt with active cultures and in supplements.  Wash your hands well to avoid spreading the virus.  Only take over-the-counter or prescription medicines for pain, discomfort, or fever as directed by your caregiver. Do not give aspirin to children. Antidiarrheal medicines are not recommended.  Ask your caregiver if you should continue to take your regular prescribed and over-the-counter medicines.  Keep all follow-up appointments as directed by your caregiver. SEEK IMMEDIATE MEDICAL CARE IF:   You are unable to keep fluids down.  You do not urinate at least once every 6 to 8 hours.  You develop shortness of breath.  You notice blood in your stool or vomit. This may look like coffee grounds.  You have abdominal pain that increases or is concentrated in one small area (localized).  You have persistent vomiting or diarrhea.  You have a fever.  The patient is a child younger than 3 months, and he or she has a fever.  The patient is a child older than 3 months, and he or she has a fever and persistent symptoms.  The patient is a child older than 3 months, and he or she has a fever and symptoms suddenly get worse.  The patient is a baby,  and he or she has no tears when crying. MAKE SURE YOU:   Understand these instructions.  Will watch your condition.  Will get help right away if you are not doing well or get worse. Document Released: 02/10/2005 Document Revised: 05/05/2011 Document Reviewed: 11/27/2010 [facility] Patient Information 2015 [place], [place]. This  information is not intended to replace advice given to you by your health care provider. Make sure you discuss any questions you have with your health care provider.

## 2014-10-24 NOTE — ED Provider Notes (Signed)
CSN: [id number]     Arrival date & time 10/24/14  1303 History   First MD Initiated Contact with Patient 10/24/14 1402     Chief Complaint  Patient presents with  . Fatigue   (Consider location/radiation/quality/duration/timing/severity/associated sxs/prior Treatment) HPI Comments: 39 year old obese female stated that yesterday morning she felt quite normal and then in the afternoon she started feeling sickly which was associated with nausea and vomiting. In the afternoon she had lost her appetite and the vomiting has slowed down that she was having significant weakness and nausea. During the night around 2 AM she woke up and vomited several times and this was followed by several episodes of diarrhea. She was feeling hot and cold but denies any known fever. She states the last episodes of diarrhea and vomiting were around 8:30 this morning. She has tried to drink sips of water but this produces nausea. Denies abdominal pain. Patient is a 39 y.o. female presenting with vomiting.  Emesis Severity:  Moderate Duration:  12 hours Timing:  Sporadic Quality:  Unable to specify Able to tolerate:  Liquids Progression:  Improving Chronicity:  New Recent urination:  Normal Context: not post-tussive and not self-induced   Relieved by:  Nothing Worsened by:  Food smell and liquids Ineffective treatments:  None tried Associated symptoms: diarrhea   Associated symptoms: no abdominal pain, no cough, no fever and no sore throat     Past Medical History  Diagnosis Date  . Abnormal Pap smear   . Tuberculosis     hx +PPD  . History of chlamydia   . Obese   . AMA (advanced maternal age) multigravida 35+   . Abortion    Past Surgical History  Procedure Laterality Date  . Dilation and curettage of uterus      tab x2  . Colposcopy    . Wisdom tooth extraction    . Cesarean section  01/28/2011    Procedure: CESAREAN SECTION;  Surgeon: [name], MD;  Location: [facility];  Service: Gynecology;   Laterality: N/A;   Family History  Problem Relation Age of Onset  . Cancer Mother     breast cancer  . Osteoporosis Mother   . Gout Father   . Sleep apnea Father   . Emphysema Father   . Anesthesia problems Neg Hx   . Hypotension Neg Hx   . Malignant hyperthermia Neg Hx   . Pseudochol deficiency Neg Hx    Social History  Substance Use Topics  . Smoking status: Never Smoker   . Smokeless tobacco: Never Used  . Alcohol Use: No   OB History    Gravida Para Term Preterm AB TAB SAB Ectopic Multiple Living   0 1  3     Review of Systems  Constitutional: Positive for activity change, appetite change and fatigue. Negative for fever.  HENT: Negative for congestion, ear discharge, ear pain, postnasal drip, rhinorrhea and sore throat.   Eyes: Negative.   Respiratory: Negative for cough and shortness of breath.   Cardiovascular: Negative for chest pain and palpitations.  Gastrointestinal: Positive for vomiting and diarrhea. Negative for abdominal pain.       As per history of present illness  Genitourinary: Negative.   Musculoskeletal: Negative.   Skin: Negative for rash.  Neurological: Positive for weakness and light-headedness.    Allergies  Gluten meal and Naproxen  Home Medications   Prior to Admission medications   Medication Sig Start Date End  Date Taking? Authorizing Provider  dicyclomine (BENTYL) 20 MG tablet Take 1 tablet (20 mg total) by mouth 2 (two) times daily. 10/01/13   Mellody Drown, PA-C  ondansetron (ZOFRAN) 4 MG tablet Take 1 tablet (4 mg total) by mouth every 6 (six) hours. 10/24/14   Hayden Rasmussen, NP   Meds Ordered and Administered this Visit  Medications - No data to display  BP 106/77 mmHg  Pulse 109  Temp(Src) 99.4 F (37.4 C) (Oral)  Resp 16  SpO2 99%  LMP 09/26/2014 No data found.   Physical Exam  Constitutional: She is oriented to person, place, and time. She appears well-developed and well-nourished. No distress.  HENT:   Mouth/Throat: Oropharynx is clear and moist. No oropharyngeal exudate.  Bilateral TMs are normal.   Eyes: Conjunctivae and EOM are normal.  Neck: Normal range of motion. Neck supple.  Cardiovascular: Normal rate, regular rhythm, normal heart sounds and intact distal pulses.   Pulmonary/Chest: Effort normal and breath sounds normal. No respiratory distress. She has no wheezes. She has no rales.  Abdominal: Soft. Bowel sounds are normal. She exhibits no distension and no mass. There is no rebound and no guarding.  Minor tenderness with deep palpation across the lower mid abdomen.  Lymphadenopathy:    She has no cervical adenopathy.  Neurological: She is alert and oriented to person, place, and time.  Skin: Skin is warm and dry.  Psychiatric: She has a normal mood and affect.  Nursing note and vitals reviewed.   ED Course  Procedures (including critical care time)  Labs Review Labs Reviewed - No data to display  Imaging Review No results found.   Visual Acuity Review  Right Eye Distance:   Left Eye Distance:   Bilateral Distance:    Right Eye Near:   Left Eye Near:    Bilateral Near:         MDM   1. Viral gastroenteritis    Recommend using Pedialight initially. Small sips at a time. Zofran 4 mg by mouth as needed for nausea or vomiting Nothing for diarrhea at this point since it is slow down. No solid foods today. If needed for discomfort or fever may use Tylenol. No NSAIDs.     Hayden Rasmussen, NP 10/24/14 1455  Hayden Rasmussen, NP 10/24/14 [phone]

## 2014-10-24 NOTE — ED Notes (Signed)
C/o feeling fatigue onset yest associated w/emesis, diarrhea, weakness, BA, dizziness Also reports decreased appetite Alert.. No signs of acute distress.

## 2014-12-21 ENCOUNTER — Emergency Department (HOSPITAL_COMMUNITY)
Admission: EM | Admit: 2014-12-21 | Discharge: 2014-12-21 | Disposition: A | Discharge status: Home / Self Care | Payer: Self-pay | Attending: Emergency Medicine | Admitting: Emergency Medicine

## 2014-12-21 ENCOUNTER — Encounter (HOSPITAL_COMMUNITY): Payer: Self-pay | Admitting: *Deleted

## 2014-12-21 DIAGNOSIS — M542 Cervicalgia: Secondary | ICD-10-CM | POA: Insufficient documentation

## 2014-12-21 DIAGNOSIS — R42 Dizziness and giddiness: Secondary | ICD-10-CM | POA: Insufficient documentation

## 2014-12-21 DIAGNOSIS — Z79899 Other long term (current) drug therapy: Secondary | ICD-10-CM | POA: Insufficient documentation

## 2014-12-21 DIAGNOSIS — Z8611 Personal history of tuberculosis: Secondary | ICD-10-CM | POA: Insufficient documentation

## 2014-12-21 DIAGNOSIS — E669 Obesity, unspecified: Secondary | ICD-10-CM | POA: Insufficient documentation

## 2014-12-21 DIAGNOSIS — Z3202 Encounter for pregnancy test, result negative: Secondary | ICD-10-CM | POA: Insufficient documentation

## 2014-12-21 DIAGNOSIS — Z8619 Personal history of other infectious and parasitic diseases: Secondary | ICD-10-CM | POA: Insufficient documentation

## 2014-12-21 LAB — BASIC METABOLIC PANEL
Anion gap: 7 (ref 5–15)
BUN: 7 mg/dL (ref 6–20)
CALCIUM: 9.4 mg/dL (ref 8.9–10.3)
CHLORIDE: 104 mmol/L (ref 101–111)
CO2: 28 mmol/L (ref 22–32)
Creatinine, Ser: 0.74 mg/dL (ref 0.44–1.00)
GFR calc Af Amer: >60 mL/min (ref >60)
GFR calc non Af Amer: >60 mL/min (ref >60)
Glucose, Bld: 94 mg/dL (ref 65–99)
Potassium: 3.7 mmol/L (ref 3.5–5.1)
SODIUM: 139 mmol/L (ref 135–145)

## 2014-12-21 LAB — URINALYSIS, ROUTINE W REFLEX MICROSCOPIC
BILIRUBIN URINE: NEGATIVE
Glucose, UA: NEGATIVE mg/dL
Ketones, ur: NEGATIVE mg/dL
Leukocytes, UA: NEGATIVE
NITRITE: NEGATIVE
PROTEIN: NEGATIVE mg/dL
Specific Gravity, Urine: 1.005 (ref 1.005–1.030)
Urobilinogen, UA: 0.2 mg/dL (ref 0.0–1.0)
pH: 6 (ref 5.0–8.0)

## 2014-12-21 LAB — URINE MICROSCOPIC-ADD ON

## 2014-12-21 LAB — CBC
HCT: 38.6 % (ref 36.0–46.0)
Hemoglobin: 12.5 g/dL (ref 12.0–15.0)
MCH: 27.6 pg (ref 26.0–34.0)
MCHC: 32.4 g/dL (ref 30.0–36.0)
MCV: 85.2 fL (ref 78.0–100.0)
Platelets: 219 10*3/uL (ref 150–400)
RBC: 4.53 MIL/uL (ref 3.87–5.11)
RDW: 13.5 % (ref 11.5–15.5)
WBC: 8.5 10*3/uL (ref 4.0–10.5)

## 2014-12-21 LAB — PREGNANCY, URINE: Preg Test, Ur: NEGATIVE

## 2014-12-21 MED ORDER — MECLIZINE HCL 50 MG PO TABS: 50.0000 mg | ORAL_TABLET | Freq: Three times a day (TID) | ORAL | Status: DC | PRN

## 2014-12-21 MED ORDER — MECLIZINE HCL 25 MG PO TABS
50.0000 mg | ORAL_TABLET | Freq: Once | ORAL | Status: AC
  Administered 2014-12-21: 50 mg via ORAL
  Filled 2014-12-21: qty 2

## 2014-12-21 MED ORDER — SODIUM CHLORIDE 0.9 % IV BOLUS (SEPSIS): 500.0000 mL | Freq: Once | INTRAVENOUS | Status: DC

## 2014-12-21 NOTE — Discharge Instructions (Signed)
Benign Positional Vertigo Vertigo is the feeling that you or your surroundings are moving when they are not. Benign positional vertigo is the most common form of vertigo. The cause of this condition is not serious (is benign). This condition is triggered by certain movements and positions (is positional). This condition can be dangerous if it occurs while you are doing something that could endanger you or others, such as driving.  CAUSES In many cases, the cause of this condition is not known. It may be caused by a disturbance in an area of the inner ear that helps your brain to sense movement and balance. This disturbance can be caused by a viral infection (labyrinthitis), head injury, or repetitive motion. RISK FACTORS This condition is more likely to develop in:  Women.  People who are 50 years of age or older. SYMPTOMS Symptoms of this condition usually happen when you move your head or your eyes in different directions. Symptoms may start suddenly, and they usually last for less than a minute. Symptoms may include:  Loss of balance and falling.  Feeling like you are spinning or moving.  Feeling like your surroundings are spinning or moving.  Nausea and vomiting.  Blurred vision.  Dizziness.  Involuntary eye movement (nystagmus). Symptoms can be mild and cause only slight annoyance, or they can be severe and interfere with daily life. Episodes of benign positional vertigo may return (recur) over time, and they may be triggered by certain movements. Symptoms may improve over time. DIAGNOSIS This condition is usually diagnosed by medical history and a physical exam of the head, neck, and ears. You may be referred to a health care provider who specializes in ear, nose, and throat (ENT) problems (otolaryngologist) or a provider who specializes in disorders of the nervous system (neurologist). You may have additional testing, including:  MRI.  A CT scan.  Eye movement tests. Your  health care provider may ask you to change positions quickly while he or she watches you for symptoms of benign positional vertigo, such as nystagmus. Eye movement may be tested with an electronystagmogram (ENG), caloric stimulation, the Dix-Hallpike test, or the roll test.  An electroencephalogram (EEG). This records electrical activity in your brain.  Hearing tests. TREATMENT Usually, your health care provider will treat this by moving your head in specific positions to adjust your inner ear back to normal. Surgery may be needed in severe cases, but this is rare. In some cases, benign positional vertigo may resolve on its own in 2-4 weeks. HOME CARE INSTRUCTIONS Safety  Move slowly.Avoid sudden body or head movements.  Avoid driving.  Avoid operating heavy machinery.  Avoid doing any tasks that would be dangerous to you or others if a vertigo episode would occur.  If you have trouble walking or keeping your balance, try using a cane for stability. If you feel dizzy or unstable, sit down right away.  Return to your normal activities as told by your health care provider. Ask your health care provider what activities are safe for you. General Instructions  Take over-the-counter and prescription medicines only as told by your health care provider.  Avoid certain positions or movements as told by your health care provider.  Drink enough fluid to keep your urine clear or pale yellow.  Keep all follow-up visits as told by your health care provider. This is important. SEEK MEDICAL CARE IF:  You have a fever.  Your condition gets worse or you develop new symptoms.  Your family or friends   notice any behavioral changes.  Your nausea or vomiting gets worse.  You have numbness or a "pins and needles" sensation. SEEK IMMEDIATE MEDICAL CARE IF:  You have difficulty speaking or moving.  You are always dizzy.  You faint.  You develop severe headaches.  You have weakness in your  legs or arms.  You have changes in your hearing or vision.  You develop a stiff neck.  You develop sensitivity to light.   This information is not intended to replace advice given to you by your health care provider. Make sure you discuss any questions you have with your health care provider.   Document Released: 11/18/2005 Document Revised: 11/01/2014 Document Reviewed: 06/05/2014 Elsevier Interactive Patient Education 2016 [employer]  

## 2014-12-21 NOTE — ED Notes (Addendum)
Patient reports intermittent x 2 weeks. Stroke scale negative. Patient ambulatory. Denies headaches. Patient states today her ears started to feel abnormal.

## 2014-12-21 NOTE — ED Provider Notes (Signed)
CSN: [id number]     Arrival date & time 12/21/14  1831 History   First MD Initiated Contact with Patient 12/21/14 2053     Chief Complaint  Patient presents with  . Dizziness     (Consider location/radiation/quality/duration/timing/severity/associated sxs/prior Treatment) Patient is a 39 y.o. female presenting with dizziness. The history is provided by the patient. No language interpreter was used.  Dizziness Quality:  Room spinning Severity:  Moderate Onset quality:  Gradual Duration:  2 weeks Progression:  Worsening (Today was the worse day.) Associated symptoms: no headaches, no shortness of breath and no weakness   Associated symptoms comment:  The patient complains of dizziness like the sense of room spinning that started 2 weeks ago. She states symptoms are worse with standing, better if she lies still. No visual changes, weakness. She has had nausea without vomiting. NO fever. She states today her ears felt "full" today making her symptoms seem worse, prompting ED evaluation.    Past Medical History  Diagnosis Date  . Abnormal Pap smear   . Tuberculosis     hx +PPD  . History of chlamydia   . Obese   . AMA (advanced maternal age) multigravida 35+   . Abortion    Past Surgical History  Procedure Laterality Date  . Dilation and curettage of uterus      tab x2  . Colposcopy    . Wisdom tooth extraction    . Cesarean section  01/28/2011    Procedure: CESAREAN SECTION;  Surgeon: [name], MD;  Location: [facility];  Service: Gynecology;  Laterality: N/A;   Family History  Problem Relation Age of Onset  . Cancer Mother     breast cancer  . Osteoporosis Mother   . Gout Father   . Sleep apnea Father   . Emphysema Father   . Anesthesia problems Neg Hx   . Hypotension Neg Hx   . Malignant hyperthermia Neg Hx   . Pseudochol deficiency Neg Hx    Social History  Substance Use Topics  . Smoking status: Never Smoker   . Smokeless tobacco: Never Used  . Alcohol  Use: No   OB History    Gravida Para Term Preterm AB TAB SAB Ectopic Multiple Living   0 1  3     Review of Systems  Constitutional: Negative for fever and chills.  HENT:       See HPI - "ear fullness"  Respiratory: Negative.  Negative for cough and shortness of breath.   Cardiovascular: Negative.   Gastrointestinal: Negative.   Musculoskeletal: Negative.   Skin: Negative.   Neurological: Positive for dizziness. Negative for syncope, weakness and headaches.      Allergies  Gluten meal and Naproxen  Home Medications   Prior to Admission medications   Medication Sig Start Date End Date Taking? Authorizing Provider  dicyclomine (BENTYL) 20 MG tablet Take 1 tablet (20 mg total) by mouth 2 (two) times daily. 10/01/13   Mellody Drown, PA-C  ondansetron (ZOFRAN) 4 MG tablet Take 1 tablet (4 mg total) by mouth every 6 (six) hours. 10/24/14   Hayden Rasmussen, NP   BP 129/78 mmHg  Pulse 63  Temp(Src) 97.9 F (36.6 C) (Oral)  Resp 18  SpO2 100% Physical Exam  Constitutional: She is oriented to person, place, and time. She appears well-developed and well-nourished.  HENT:  Head: Normocephalic.  Eyes: Pupils are equal, round, and reactive to light.  Neck: Normal range  of motion. Neck supple.  Cardiovascular: Normal rate and regular rhythm.   Pulmonary/Chest: Effort normal and breath sounds normal.  Abdominal: Soft. Bowel sounds are normal. There is no tenderness. There is no rebound and no guarding.  Musculoskeletal: Normal range of motion.  Midline cervical and bilateral paracervical tenderness without swelling, redness or warmth. FROM. No nuchal rigidity.  Neurological: She is alert and oriented to person, place, and time. She has normal strength and normal reflexes. No sensory deficit. She displays a negative Romberg sign. Coordination normal.  Speech clear and focused. No deficits of coordination. CN's 3-12 grossly intact.   Skin: Skin is warm and dry. No rash noted.   Psychiatric: She has a normal mood and affect.    ED Course  Procedures (including critical care time) Labs Review Labs Reviewed  URINALYSIS, ROUTINE W REFLEX MICROSCOPIC (NOT AT [facility]) - Abnormal; Notable for the following:    Hgb urine dipstick SMALL (*)    All other components within normal limits  URINE MICROSCOPIC-ADD ON - Abnormal; Notable for the following:    Squamous Epithelial / LPF FEW (*)    All other components within normal limits  BASIC METABOLIC PANEL  CBC   Results for orders placed or performed during the hospital encounter of 12/21/14  Basic metabolic panel  Result Value Ref Range   Sodium 139 135 - 145 mmol/L   Potassium 3.7 3.5 - 5.1 mmol/L   Chloride 104 101 - 111 mmol/L   CO2 28 22 - 32 mmol/L   Glucose, Bld 94 65 - 99 mg/dL   BUN 7 6 - 20 mg/dL   Creatinine, Ser 1.610.74 0.44 - 1.00 mg/dL   Calcium 9.4 8.9 - 09.610.3 mg/dL   GFR calc non Af Amer >60 >60 mL/min   GFR calc Af Amer >60 >60 mL/min   Anion gap 7 5 - 15  CBC  Result Value Ref Range   WBC 8.5 4.0 - 10.5 K/uL   RBC 4.53 3.87 - 5.11 MIL/uL   Hemoglobin 12.5 12.0 - 15.0 g/dL   HCT 04.538.6 40.936.0 - 81.146.0 %   MCV 85.2 78.0 - 100.0 fL   MCH 27.6 26.0 - 34.0 pg   MCHC 32.4 30.0 - 36.0 g/dL   RDW 91.413.5 78.211.5 - 95.615.5 %   Platelets 219 150 - 400 K/uL  Urinalysis, Routine w reflex microscopic (not at [facility])  Result Value Ref Range   Color, Urine YELLOW YELLOW   APPearance CLEAR CLEAR   Specific Gravity, Urine 1.005 1.005 - 1.030   pH 6.0 5.0 - 8.0   Glucose, UA NEGATIVE NEGATIVE mg/dL   Hgb urine dipstick SMALL (A) NEGATIVE   Bilirubin Urine NEGATIVE NEGATIVE   Ketones, ur NEGATIVE NEGATIVE mg/dL   Protein, ur NEGATIVE NEGATIVE mg/dL   Urobilinogen, UA 0.2 0.0 - 1.0 mg/dL   Nitrite NEGATIVE NEGATIVE   Leukocytes, UA NEGATIVE NEGATIVE  Urine microscopic-add on  Result Value Ref Range   Squamous Epithelial / LPF FEW (A) RARE   WBC, UA 0-2 <3 WBC/hpf   RBC / HPF 0-2 <3 RBC/hpf    Imaging Review No  results found. I have personally reviewed and evaluated these images and lab results as part of my medical decision-making.   EKG Interpretation None      MDM   Final diagnoses:  None    1. Vertigo  The plan was to provide IV fluids, however, patient refused IV stick.  PO Meclizine provided with improvement of symptoms. VSS, no  significant orthostasis. Labs reassuring. Suspect vertigo. Discussed neurology follow up with the patient and referral provided. She is felt improved and stable for discharge.     Elpidio Anis, PA-C 12/25/14 0008  Benjiman Core, MD 12/27/14 [id number]

## 2014-12-21 NOTE — ED Notes (Signed)
Patient refusing IV and fluid at this time.  Provider made aware.

## 2016-01-25 ENCOUNTER — Ambulatory Visit (HOSPITAL_COMMUNITY)
Admission: EM | Admit: 2016-01-25 | Discharge: 2016-01-25 | Disposition: A | Discharge status: Home / Self Care | Payer: Self-pay | Attending: Emergency Medicine | Admitting: Emergency Medicine

## 2016-01-25 ENCOUNTER — Encounter (HOSPITAL_COMMUNITY): Payer: Self-pay | Admitting: *Deleted

## 2016-01-25 DIAGNOSIS — B029 Zoster without complications: Secondary | ICD-10-CM

## 2016-01-25 MED ORDER — VALACYCLOVIR HCL 1 G PO TABS: 1000.0000 mg | ORAL_TABLET | Freq: Three times a day (TID) | ORAL | 0 refills | Status: AC

## 2016-01-25 MED ORDER — HYDROCODONE-ACETAMINOPHEN 5-325 MG PO TABS: 1.0000 | ORAL_TABLET | Freq: Four times a day (QID) | ORAL | 0 refills | Status: DC | PRN

## 2016-01-25 MED ORDER — TRIAMCINOLONE ACETONIDE 0.1 % EX CREA: 1.0000 "application " | TOPICAL_CREAM | Freq: Two times a day (BID) | CUTANEOUS | 0 refills | Status: DC

## 2016-01-25 NOTE — ED Provider Notes (Signed)
[facility]    CSN: [id number] Arrival date & time: 01/25/16  1731     History   Chief Complaint Chief Complaint  Patient presents with  . Rash    HPI Rhonda Jackson is a 40 y.o. female.   HPI She is a 40 year old woman here for evaluation of rash. She states the rash started right before Thanksgiving with a spot on her left mid back. She has developed more spots, but only on her left back. She states they're quite painful and sometimes itchy. She denies any fevers. She is currently under a lot of stress that she recently moved into a new house and has 3 children. She did have chickenpox as a child.  Past Medical History:  Diagnosis Date  . Abnormal Pap smear   . Abortion   . AMA (advanced maternal age) multigravida 35+   . History of chlamydia   . Obese   . Tuberculosis    hx +PPD    Patient Active Problem List   Diagnosis Date Noted  . Advanced maternal age in pregnancy 01/01/2011  . Supervision of other normal pregnancy 01/01/2011  . Obesity 01/01/2011    Past Surgical History:  Procedure Laterality Date  . CESAREAN SECTION  01/28/2011   Procedure: CESAREAN SECTION;  Surgeon: Lesly DukesKelly H. Leggett, MD;  Location: [facility];  Service: Gynecology;  Laterality: N/A;  . COLPOSCOPY    . DILATION AND CURETTAGE OF UTERUS     tab x2  . WISDOM TOOTH EXTRACTION      OB History    Gravida Para Term Preterm AB Living   7 3 3   4 3    SAB TAB Ectopic Multiple Live Births   0 3 1   3        Home Medications    Prior to Admission medications   Medication Sig Start Date End Date Taking? Authorizing Provider  HYDROcodone-acetaminophen (NORCO) 5-325 MG tablet Take 1 tablet by mouth every 6 (six) hours as needed for moderate pain. 01/25/16   Charm RingsErin J Eugenio Dollins, MD  meclizine (ANTIVERT) 50 MG tablet Take 1 tablet (50 mg total) by mouth 3 (three) times daily as needed. 12/21/14   Shari Upstill, PA-C  ondansetron (ZOFRAN) 4 MG tablet Take 1 tablet (4 mg total) by mouth every  6 (six) hours. Patient not taking: Reported on 12/21/2014 10/24/14   Hayden Rasmussenavid Mabe, NP  triamcinolone cream (KENALOG) 0.1 % Apply 1 application topically 2 (two) times daily. 01/25/16   Charm RingsErin J Tammela Bales, MD  valACYclovir (VALTREX) 1000 MG tablet Take 1 tablet (1,000 mg total) by mouth 3 (three) times daily. 01/25/16 02/01/16  Charm RingsErin J Yisroel Mullendore, MD    Family History Family History  Problem Relation Age of Onset  . Gout Father   . Sleep apnea Father   . Emphysema Father   . Cancer Mother     breast cancer  . Osteoporosis Mother   . Anesthesia problems Neg Hx   . Hypotension Neg Hx   . Malignant hyperthermia Neg Hx   . Pseudochol deficiency Neg Hx     Social History Social History  Substance Use Topics  . Smoking status: Never Smoker  . Smokeless tobacco: Never Used  . Alcohol use No     Allergies   Gluten meal and Naproxen   Review of Systems Review of Systems As in history of present illness  Physical Exam Triage Vital Signs ED Triage Vitals [01/25/16 1748]  Enc Vitals Group     BP  109/75     Pulse Rate 76     Resp 14     Temp 98.3 F (36.8 C)     Temp Source Oral     SpO2 98 %     Weight      Height      Head Circumference      Peak Flow      Pain Score      Pain Loc      Pain Edu?      Excl. in GC?    No data found.   Updated Vital Signs BP 109/75 (BP Location: Right Arm)   Pulse 76   Temp 98.3 F (36.8 C) (Oral)   Resp 14   LMP 01/25/2016   SpO2 98%   Visual Acuity Right Eye Distance:   Left Eye Distance:   Bilateral Distance:    Right Eye Near:   Left Eye Near:    Bilateral Near:     Physical Exam  Constitutional: She is oriented to person, place, and time. She appears well-developed and well-nourished. No distress.  Cardiovascular: Normal rate.   Pulmonary/Chest: Effort normal.  Neurological: She is alert and oriented to person, place, and time.  Skin: Rash (erythematous papular rash in dermatomal distribution of left mid back.) noted.      UC Treatments / Results  Labs (all labs ordered are listed, but only abnormal results are displayed) Labs Reviewed - No data to display  EKG  EKG Interpretation None       Radiology No results found.  Procedures Procedures (including critical care time)  Medications Ordered in UC Medications - No data to display   Initial Impression / Assessment and Plan / UC Course  I have reviewed the triage vital signs and the nursing notes.  Pertinent labs & imaging results that were available during my care of the patient were reviewed by me and considered in my medical decision making (see chart for details).  Clinical Course     Treat with Valtrex for 1 week. Triamcinolone cream and hydrocodone as needed for pain and itching. Follow up as needed.  Final Clinical Impressions(s) / UC Diagnoses   Final diagnoses:  Herpes zoster without complication    New Prescriptions Discharge Medication List as of 01/25/2016  6:14 PM    START taking these medications   Details  HYDROcodone-acetaminophen (NORCO) 5-325 MG tablet Take 1 tablet by mouth every 6 (six) hours as needed for moderate pain., Starting Fri 01/25/2016, Print    triamcinolone cream (KENALOG) 0.1 % Apply 1 application topically 2 (two) times daily., Starting Fri 01/25/2016, Normal    valACYclovir (VALTREX) 1000 MG tablet Take 1 tablet (1,000 mg total) by mouth 3 (three) times daily., Starting Fri 01/25/2016, Until Fri 02/01/2016, Normal         Charm RingsErin J Marybella Ethier, MD 01/25/16 [phone]

## 2016-01-25 NOTE — Discharge Instructions (Signed)
You have shingles. This comes from a reactivation of chickenpox virus. The most common reason to get shingles is a lot of stress. Take Valtrex 3 times a day for 7 days. Use the triamcinolone cream twice a day as needed to help with pain and itching. Use the hydrocodone at bedtime to help with pain. You should see gradual improvement over the next week or so. Follow-up as needed.

## 2016-01-25 NOTE — ED Triage Notes (Signed)
Pt  Has     Symptoms  Of        Rash     On  Back        Across   Her  Back    The  Rash       Is  painfull  To the touch  And  Upon  Contact

## 2017-09-07 ENCOUNTER — Other Ambulatory Visit (HOSPITAL_BASED_OUTPATIENT_CLINIC_OR_DEPARTMENT_OTHER): Payer: Self-pay | Admitting: Physician Assistant

## 2017-09-07 ENCOUNTER — Ambulatory Visit (HOSPITAL_BASED_OUTPATIENT_CLINIC_OR_DEPARTMENT_OTHER)
Admission: RE | Admit: 2017-09-07 | Discharge: 2017-09-07 | Disposition: A | Discharge status: Home / Self Care | Payer: Managed Care, Other (non HMO) | Source: Ambulatory Visit | Attending: Physician Assistant | Admitting: Physician Assistant

## 2017-09-07 DIAGNOSIS — M7989 Other specified soft tissue disorders: Secondary | ICD-10-CM | POA: Insufficient documentation

## 2017-09-07 DIAGNOSIS — M79661 Pain in right lower leg: Secondary | ICD-10-CM

## 2017-09-29 ENCOUNTER — Encounter: Payer: Managed Care, Other (non HMO) | Admitting: Advanced Practice Midwife

## 2017-11-10 ENCOUNTER — Ambulatory Visit: Payer: Managed Care, Other (non HMO) | Admitting: Podiatry

## 2017-11-10 ENCOUNTER — Encounter: Payer: Self-pay | Admitting: Podiatry

## 2017-11-10 ENCOUNTER — Ambulatory Visit (INDEPENDENT_AMBULATORY_CARE_PROVIDER_SITE_OTHER): Payer: Managed Care, Other (non HMO)

## 2017-11-10 VITALS — BP 123/82 | HR 66 | Ht 61.5 in | Wt 237.0 lb

## 2017-11-10 DIAGNOSIS — M722 Plantar fascial fibromatosis: Secondary | ICD-10-CM

## 2017-11-10 DIAGNOSIS — Z1151 Encounter for screening for human papillomavirus (HPV): Secondary | ICD-10-CM

## 2017-11-10 DIAGNOSIS — Z113 Encounter for screening for infections with a predominantly sexual mode of transmission: Secondary | ICD-10-CM

## 2017-11-10 DIAGNOSIS — Z124 Encounter for screening for malignant neoplasm of cervix: Secondary | ICD-10-CM

## 2017-11-10 DIAGNOSIS — Z01419 Encounter for gynecological examination (general) (routine) without abnormal findings: Secondary | ICD-10-CM

## 2017-11-10 DIAGNOSIS — Z8759 Personal history of other complications of pregnancy, childbirth and the puerperium: Secondary | ICD-10-CM

## 2017-11-10 MED ORDER — MELOXICAM 15 MG PO TABS: 15.0000 mg | ORAL_TABLET | Freq: Every day | ORAL | 0 refills | Status: AC

## 2017-11-10 MED ORDER — MELOXICAM 15 MG PO TABS: 15.0000 mg | ORAL_TABLET | Freq: Every day | ORAL | 0 refills | Status: DC

## 2017-11-10 NOTE — Patient Instructions (Signed)

## 2017-11-10 NOTE — Progress Notes (Signed)
GYNECOLOGY ANNUAL PREVENTATIVE CARE ENCOUNTER NOTE  Subjective:   Rhonda Jackson is a 42 y.o. [phone] female here for a routine annual gynecologic exam.  Current complaints: none.   Denies abnormal vaginal bleeding, discharge, pelvic pain, problems with intercourse or other gynecologic concerns.   She reports a TAB in [place] in August and is requesting follow up for that. She denies any pain or bleeding since the procedure.    Gynecologic History Patient's last menstrual period was 11/05/2017 (exact date). Contraception: vaginal spermicide Last Pap: patient reports 3 years ago. Results were: normal with negative HPV Last mammogram: 2015 . Results were: normal  Obstetric History OB History  Gravida Para Term Preterm AB Living  8 3 3   5 3   SAB TAB Ectopic Multiple Live Births  0 4 1   3     # Outcome Date GA Lbr Len/2nd Weight Sex Delivery Anes PTL Lv  8 TAB 2019          7 Term 01/28/11 7168w2d 65:15 / 02:22 7 lb 9.7 oz (3.45 kg) F CS-LTranv Spinal  LIV  6 Ectopic 2011          5 TAB 2006          4 TAB 2001          3 Term 05/20/98 2913w0d   M Vag-Spont None  LIV     Birth Comments: low aminotic fluid, son has autism  2 TAB 06/24/95             Birth Comments: no complications- was given Demerol for the procedure  1 Term 1996 4649w0d 08:00 6 lb 10 oz (3.005 kg) F Vag-Spont None  LIV    Past Medical History:  Diagnosis Date  . Abnormal Pap smear   . Abortion   . AMA (advanced maternal age) multigravida 35+   . History of chlamydia   . Obese   . Tuberculosis    hx +PPD  . Vaginal Pap smear, abnormal    colpo    Past Surgical History:  Procedure Laterality Date  . CESAREAN SECTION  01/28/2011   Procedure: CESAREAN SECTION;  Surgeon: Lesly DukesKelly H. Leggett, MD;  Location: [facility];  Service: Gynecology;  Laterality: N/A;  . COLPOSCOPY    . DILATION AND CURETTAGE OF UTERUS     tab x2  . WISDOM TOOTH EXTRACTION      Current Outpatient Medications on File Prior to Visit    Medication Sig Dispense Refill  . HYDROcodone-acetaminophen (NORCO) 5-325 MG tablet Take 1 tablet by mouth every 6 (six) hours as needed for moderate pain. (Patient not taking: Reported on 11/10/2017) 20 tablet 0  . meclizine (ANTIVERT) 50 MG tablet Take 1 tablet (50 mg total) by mouth 3 (three) times daily as needed. (Patient not taking: Reported on 11/10/2017) 30 tablet 0  . meloxicam (MOBIC) 15 MG tablet TAKE 1 TABLET BY MOUTH ONCE DAILY FOR 30 DAYS  0  . ondansetron (ZOFRAN) 4 MG tablet Take 1 tablet (4 mg total) by mouth every 6 (six) hours. (Patient not taking: Reported on 12/21/2014) 12 tablet 0  . predniSONE (DELTASONE) 20 MG tablet TAKE 3 TABLETS BY MOUTH FOR 2DAYS THEN 2 TABLETS FOR 2DAYS THEN 1 TABLET FOR 2DAYS FOR 6DAYS  0  . triamcinolone cream (KENALOG) 0.1 % Apply 1 application topically 2 (two) times daily. (Patient not taking: Reported on 11/10/2017) 30 g 0   No current facility-administered medications on file prior to visit.  Allergies  Allergen Reactions  . Gluten Meal Other (See Comments)    Joints swell up  . Naproxen Other (See Comments)    "bad stomach ache"    Social History:  reports that she has been smoking cigarettes. She has never used smokeless tobacco. She reports that she does not drink alcohol or use drugs.  Family History  Problem Relation Age of Onset  . Gout Father   . Sleep apnea Father   . Emphysema Father   . Cancer Mother        breast cancer  . Osteoporosis Mother   . Anesthesia problems Neg Hx   . Hypotension Neg Hx   . Malignant hyperthermia Neg Hx   . Pseudochol deficiency Neg Hx     The following portions of the patient's history were reviewed and updated as appropriate: allergies, current medications, past family history, past medical history, past social history, past surgical history and problem list.  Review of Systems Pertinent items noted in HPI and remainder of comprehensive ROS otherwise negative.   Objective:  BP 123/82  (BP Location: Left Arm, Patient Position: Sitting, Cuff Size: Large)   Pulse 66   Ht 5' 1.5" (1.562 m)   Wt 237 lb (107.5 kg)   LMP 11/05/2017 (Exact Date)   BMI 44.06 kg/m  CONSTITUTIONAL: Well-developed, well-nourished female in no acute distress.  HENT:  Normocephalic, atraumatic, External right and left ear normal. Oropharynx is clear and moist EYES: Conjunctivae and EOM are normal. Pupils are equal, round, and reactive to light. No scleral icterus.  NECK: Normal range of motion, supple, no masses.  Normal thyroid.  SKIN: Skin is warm and dry. No rash noted. Not diaphoretic. No erythema. No pallor. MUSCULOSKELETAL: Normal range of motion. No tenderness.  No cyanosis, clubbing, or edema.  2+ distal pulses. NEUROLOGIC: Alert and oriented to person, place, and time. Normal reflexes, muscle tone coordination. No cranial nerve deficit noted. PSYCHIATRIC: Normal mood and affect. Normal behavior. Normal judgment and thought content. CARDIOVASCULAR: Normal heart rate noted, regular rhythm RESPIRATORY: Clear to auscultation bilaterally. Effort and breath sounds normal, no problems with respiration noted. BREASTS: Symmetric in size. No masses, skin changes, nipple drainage, or lymphadenopathy. ABDOMEN: Soft, normal bowel sounds, no distention noted.  No tenderness, rebound or guarding.  PELVIC: Normal appearing external genitalia; normal appearing vaginal mucosa and cervix.  No abnormal discharge noted.  Pap smear obtained.  Normal uterine size, no other palpable masses, no uterine or adnexal tenderness.  Assessment and Plan:  1. Well woman exam with routine gynecological exam - Cytology - PAP - HIV antibody (with reflex) - RPR - Hepatitis B Surface AntiGEN - Hepatitis C Antibody - CBC - Comprehensive metabolic panel - MM Digital Screening; Future  2. Screening for STD (sexually transmitted disease) - Per patient request. No concern of exposure - HIV antibody (with reflex) - RPR -  Hepatitis B Surface AntiGEN - Hepatitis C Antibody  3. Abortion history - Will get HCG to confirm return to zero - CBC - Comprehensive metabolic panel - Beta hCG quant (ref lab)  Will follow up results of pap smear and manage accordingly. Mammogram scheduled Routine preventative health maintenance measures emphasized. Please refer to After Visit Summary for other counseling recommendations.    Rolm Bookbinder, CNM 11/10/17 10:39 AM

## 2017-11-10 NOTE — Progress Notes (Signed)
Subjective:    Patient ID: Rhonda Jackson, female    DOB: August 24, 1975, 42 y.o.   MRN: [id number]  HPI  42 year old female presents the office with concerns of right heel pain which is been ongoing for last 2 to 3 months.  She states that she has been seeing reflexologist for this and she was told that she likely had plantar fasciitis.  She states that she has a history of sciatica several years ago and she was wearing inserts for her shoes because of her feet roll inwards however she stopped doing this.  She states that one point couple weeks ago she started getting pain going up the leg and she had a venous duplex which was negative.  She states that she wants a "quick fix" to this.  She does try purchasing orthopedic small balls and she hold purchased some inserts for her tennis shoes.  She describes throbbing to the ball of the heel.  She also states that she has an unknown autoimmune disease.  She states that she previously has taken meloxicam as well as intermittent steroid pack when she has issues but currently not taking any of these.  She denies any numbness or tingling.  Pain is not likely present.    Review of Systems  All other systems reviewed and are negative.  Past Medical History:  Diagnosis Date  . Abnormal Pap smear   . Abortion   . AMA (advanced maternal age) multigravida 35+   . History of chlamydia   . Obese   . Tuberculosis    hx +PPD  . Vaginal Pap smear, abnormal    colpo    Past Surgical History:  Procedure Laterality Date  . CESAREAN SECTION  01/28/2011   Procedure: CESAREAN SECTION;  Surgeon: Lesly DukesKelly H. Leggett, MD;  Location: [facility];  Service: Gynecology;  Laterality: N/A;  . COLPOSCOPY    . DILATION AND CURETTAGE OF UTERUS     tab x2  . WISDOM TOOTH EXTRACTION       Current Outpatient Medications:  .  HYDROcodone-acetaminophen (NORCO) 5-325 MG tablet, Take 1 tablet by mouth every 6 (six) hours as needed for moderate pain. (Patient not taking: Reported  on 11/10/2017), Disp: 20 tablet, Rfl: 0 .  meclizine (ANTIVERT) 50 MG tablet, Take 1 tablet (50 mg total) by mouth 3 (three) times daily as needed. (Patient not taking: Reported on 11/10/2017), Disp: 30 tablet, Rfl: 0 .  meloxicam (MOBIC) 15 MG tablet, TAKE 1 TABLET BY MOUTH ONCE DAILY FOR 30 DAYS, Disp: , Rfl: 0 .  meloxicam (MOBIC) 15 MG tablet, Take 1 tablet (15 mg total) by mouth daily., Disp: 14 tablet, Rfl: 0 .  ondansetron (ZOFRAN) 4 MG tablet, Take 1 tablet (4 mg total) by mouth every 6 (six) hours. (Patient not taking: Reported on 12/21/2014), Disp: 12 tablet, Rfl: 0 .  predniSONE (DELTASONE) 20 MG tablet, TAKE 3 TABLETS BY MOUTH FOR 2DAYS THEN 2 TABLETS FOR 2DAYS THEN 1 TABLET FOR 2DAYS FOR 6DAYS, Disp: , Rfl: 0 .  triamcinolone cream (KENALOG) 0.1 %, Apply 1 application topically 2 (two) times daily. (Patient not taking: Reported on 11/10/2017), Disp: 30 g, Rfl: 0  Allergies  Allergen Reactions  . Gluten Meal Other (See Comments)    Joints swell up  . Naproxen Other (See Comments)    "bad stomach ache"        Objective:   Physical Exam  General: AAO x3, NAD  Dermatological: Skin is warm, dry and supple bilateral.  Nails x 10 are well manicured; remaining integument appears unremarkable at this time. There are no open sores, no preulcerative lesions, no rash or signs of infection present.  Vascular: Dorsalis Pedis artery and Posterior Tibial artery pedal pulses are 2/4 bilateral with immedate capillary fill time. Pedal hair growth present. No varicosities and no lower extremity edema present bilateral. There is no pain with calf compression, swelling, warmth, erythema.   Neruologic: Grossly intact via light touch bilateral. Protective threshold with Semmes Wienstein monofilament intact to all pedal sites bilateral.  Negative Tinel sign.  Musculoskeletal: There is tenderness palpation on the plantar medial tubercle of the calcaneus at the insertion of plantar fascia on the right  foot.  No significant discomfort in the arch of the foot overall plantar fascia appears to be intact.  There is no pain on the course or insertion of the Achilles tendon.  There is no pain on lateral compression of calcaneus.  Very minimal edema on the plantar medial tubercle of the calcaneus at the insertion of plantar fashion but no other areas of edema, erythema.  Muscular strength 5/5 in all groups tested bilateral. Equinus present.   Gait: Unassisted, Nonantalgic.     Assessment & Plan:  42 year old female with right heel pain, plantar fasciitis -Treatment options discussed including all alternatives, risks, and complications -Etiology of symptoms were discussed -X-rays were obtained and reviewed with the patient.  Very minimal inferior calcaneal spurring present.  No evidence of acute fracture. -We long discussion regards to treatment options today.  She states that she wants a "quick fix".  I discussed with her overall this is the same time but overall she should get improvement.  She was interested in EPAT and surgery.  I advised against surgery at this point for her and she seemed to make this point in this after discussion she understood why.  She would like to proceed with EPAT.  I will get her reappointed at her convenience for this.  Discussed this is not a guarantee resolution of symptoms.  We need to work on the other issues that is causing heel pain.  Discussed stretching, icing exercises daily.  Dispensed a night splint as well as plantar fascial braces to wear during the day.-Steroid injections and prescribed a short course of meloxicam.  Continue with the supportive shoes and inserts.  Vivi Barrack DPM

## 2017-11-11 DIAGNOSIS — M722 Plantar fascial fibromatosis: Secondary | ICD-10-CM | POA: Insufficient documentation

## 2017-11-11 LAB — COMPREHENSIVE METABOLIC PANEL
ALT: 15 IU/L (ref 0–32)
AST: 14 IU/L (ref 0–40)
Albumin/Globulin Ratio: 1.6 (ref 1.2–2.2)
Albumin: 3.9 g/dL (ref 3.5–5.5)
Alkaline Phosphatase: 56 IU/L (ref 39–117)
BUN/Creatinine Ratio: 18 (ref 9–23)
BUN: 13 mg/dL (ref 6–24)
Bilirubin Total: 0.3 mg/dL (ref 0.0–1.2)
CO2: 21 mmol/L (ref 20–29)
CREATININE: 0.72 mg/dL (ref 0.57–1.00)
Calcium: 8.5 mg/dL — ABNORMAL LOW (ref 8.7–10.2)
Chloride: 104 mmol/L (ref 96–106)
GFR calc Af Amer: 120 mL/min/{1.73_m2} (ref >59)
GFR calc non Af Amer: 104 mL/min/{1.73_m2} (ref >59)
GLOBULIN, TOTAL: 2.5 g/dL (ref 1.5–4.5)
Glucose: 110 mg/dL — ABNORMAL HIGH (ref 65–99)
POTASSIUM: 4.1 mmol/L (ref 3.5–5.2)
Sodium: 138 mmol/L (ref 134–144)
Total Protein: 6.4 g/dL (ref 6.0–8.5)

## 2017-11-11 LAB — CBC
HEMATOCRIT: 35.8 % (ref 34.0–46.6)
Hemoglobin: 12.1 g/dL (ref 11.1–15.9)
MCH: 28.2 pg (ref 26.6–33.0)
MCHC: 33.8 g/dL (ref 31.5–35.7)
MCV: 83 fL (ref 79–97)
Platelets: 250 10*3/uL (ref 150–450)
RBC: 4.29 x10E6/uL (ref 3.77–5.28)
RDW: 13.1 % (ref 12.3–15.4)
WBC: 6.7 10*3/uL (ref 3.4–10.8)

## 2017-11-11 LAB — CYTOLOGY - PAP
CHLAMYDIA, DNA PROBE: NEGATIVE
Diagnosis: NEGATIVE
HPV: NOT DETECTED
NEISSERIA GONORRHEA: NEGATIVE

## 2017-11-11 LAB — BETA HCG QUANT (REF LAB)

## 2017-11-11 LAB — RPR: RPR Ser Ql: NONREACTIVE

## 2017-11-11 LAB — HIV ANTIBODY (ROUTINE TESTING W REFLEX): HIV Screen 4th Generation wRfx: NONREACTIVE

## 2017-11-11 LAB — HEPATITIS B SURFACE ANTIGEN: Hepatitis B Surface Ag: NEGATIVE

## 2017-11-11 LAB — HEPATITIS C ANTIBODY: Hep C Virus Ab: 0.3 s/co ratio (ref 0.0–0.9)

## 2017-11-12 ENCOUNTER — Ambulatory Visit (INDEPENDENT_AMBULATORY_CARE_PROVIDER_SITE_OTHER): Payer: Managed Care, Other (non HMO) | Admitting: Medical

## 2017-11-12 ENCOUNTER — Encounter: Payer: Self-pay | Admitting: Medical

## 2017-11-12 VITALS — BP 138/75 | HR 58 | Temp 98.2°F | Resp 16 | Ht 61.0 in | Wt 235.8 lb

## 2017-11-12 DIAGNOSIS — F32A Depression, unspecified: Secondary | ICD-10-CM

## 2017-11-12 DIAGNOSIS — F329 Major depressive disorder, single episode, unspecified: Secondary | ICD-10-CM

## 2017-11-12 DIAGNOSIS — M2550 Pain in unspecified joint: Secondary | ICD-10-CM | POA: Diagnosis not present

## 2017-11-12 DIAGNOSIS — F419 Anxiety disorder, unspecified: Secondary | ICD-10-CM

## 2017-11-12 DIAGNOSIS — M722 Plantar fascial fibromatosis: Secondary | ICD-10-CM

## 2017-11-12 MED ORDER — BUSPIRONE HCL 7.5 MG PO TABS: 7.5000 mg | ORAL_TABLET | Freq: Two times a day (BID) | ORAL | 0 refills | Status: DC

## 2017-11-12 MED ORDER — VENLAFAXINE HCL ER 37.5 MG PO CP24: 37.5000 mg | ORAL_CAPSULE | Freq: Every day | ORAL | 0 refills | Status: DC

## 2017-11-12 NOTE — Patient Instructions (Addendum)
For anxiety and depression, I want you to start buspar and effexor. Start buspar first and then in 3 days add on effexor.   If you have severe anxiety or depression with thoughts of harm to self or others then recommend ED evaluation [facility].  Number of behavioral health given. Please call and see if can get scheduled with Terri.  Follow up in 2 weeks or as needed
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
## 2017-11-25 ENCOUNTER — Other Ambulatory Visit: Payer: Managed Care, Other (non HMO)

## 2017-11-26 ENCOUNTER — Ambulatory Visit: Payer: Managed Care, Other (non HMO) | Admitting: Medical

## 2017-12-07 ENCOUNTER — Ambulatory Visit
Admission: RE | Admit: 2017-12-07 | Discharge: 2017-12-07 | Disposition: A | Discharge status: Home / Self Care | Payer: Managed Care, Other (non HMO) | Source: Ambulatory Visit

## 2017-12-07 DIAGNOSIS — Z01419 Encounter for gynecological examination (general) (routine) without abnormal findings: Secondary | ICD-10-CM

## 2019-01-12 ENCOUNTER — Other Ambulatory Visit: Payer: Self-pay

## 2019-01-13 ENCOUNTER — Ambulatory Visit (INDEPENDENT_AMBULATORY_CARE_PROVIDER_SITE_OTHER): Payer: Managed Care, Other (non HMO) | Admitting: Medical

## 2019-01-13 ENCOUNTER — Ambulatory Visit (HOSPITAL_BASED_OUTPATIENT_CLINIC_OR_DEPARTMENT_OTHER)
Admission: RE | Admit: 2019-01-13 | Discharge: 2019-01-13 | Disposition: A | Discharge status: Home / Self Care | Payer: Managed Care, Other (non HMO) | Source: Ambulatory Visit | Attending: Medical | Admitting: Medical

## 2019-01-13 ENCOUNTER — Encounter: Payer: Self-pay | Admitting: Medical

## 2019-01-13 VITALS — BP 132/52 | HR 83 | Temp 96.5°F | Resp 16 | Ht 61.0 in | Wt 226.0 lb

## 2019-01-13 DIAGNOSIS — M25569 Pain in unspecified knee: Secondary | ICD-10-CM

## 2019-01-13 DIAGNOSIS — M542 Cervicalgia: Secondary | ICD-10-CM

## 2019-01-13 DIAGNOSIS — M25512 Pain in left shoulder: Secondary | ICD-10-CM | POA: Diagnosis not present

## 2019-01-13 DIAGNOSIS — Z0000 Encounter for general adult medical examination without abnormal findings: Secondary | ICD-10-CM

## 2019-01-13 DIAGNOSIS — Z1231 Encounter for screening mammogram for malignant neoplasm of breast: Secondary | ICD-10-CM | POA: Diagnosis not present

## 2019-01-13 DIAGNOSIS — F17200 Nicotine dependence, unspecified, uncomplicated: Secondary | ICD-10-CM

## 2019-01-13 LAB — COMPREHENSIVE METABOLIC PANEL
ALT: 15 U/L (ref 0–35)
AST: 12 U/L (ref 0–37)
Albumin: 4.2 g/dL (ref 3.5–5.2)
Alkaline Phosphatase: 54 U/L (ref 39–117)
BUN: 11 mg/dL (ref 6–23)
CO2: 29 mEq/L (ref 19–32)
Calcium: 9 mg/dL (ref 8.4–10.5)
Chloride: 103 mEq/L (ref 96–112)
Creatinine, Ser: 0.72 mg/dL (ref 0.40–1.20)
GFR: 88.45 mL/min (ref >60.00)
Glucose, Bld: 90 mg/dL (ref 70–99)
Potassium: 4.1 mEq/L (ref 3.5–5.1)
Sodium: 138 mEq/L (ref 135–145)
Total Bilirubin: 0.5 mg/dL (ref 0.2–1.2)
Total Protein: 6.8 g/dL (ref 6.0–8.3)

## 2019-01-13 LAB — CBC WITH DIFFERENTIAL/PLATELET
Basophils Absolute: 0 10*3/uL (ref 0.0–0.1)
Basophils Relative: 0.4 % (ref 0.0–3.0)
Eosinophils Absolute: 0.2 10*3/uL (ref 0.0–0.7)
Eosinophils Relative: 2.4 % (ref 0.0–5.0)
HCT: 39 % (ref 36.0–46.0)
Hemoglobin: 13 g/dL (ref 12.0–15.0)
Lymphocytes Relative: 29 % (ref 12.0–46.0)
Lymphs Abs: 2.2 10*3/uL (ref 0.7–4.0)
MCHC: 33.2 g/dL (ref 30.0–36.0)
MCV: 84.9 fl (ref 78.0–100.0)
Monocytes Absolute: 0.4 10*3/uL (ref 0.1–1.0)
Monocytes Relative: 5.3 % (ref 3.0–12.0)
Neutro Abs: 4.8 10*3/uL (ref 1.4–7.7)
Neutrophils Relative %: 62.9 % (ref 43.0–77.0)
Platelets: 218 10*3/uL (ref 150.0–400.0)
RBC: 4.59 Mil/uL (ref 3.87–5.11)
RDW: 14.6 % (ref 11.5–15.5)
WBC: 7.6 10*3/uL (ref 4.0–10.5)

## 2019-01-13 LAB — LIPID PANEL
Cholesterol: 192 mg/dL (ref 0–200)
HDL: 44.8 mg/dL (ref >39.00)
LDL Cholesterol: 124 mg/dL — ABNORMAL HIGH (ref 0–99)
NonHDL: 147.59
Total CHOL/HDL Ratio: 4
Triglycerides: 120 mg/dL (ref 0.0–149.0)
VLDL: 24 mg/dL (ref 0.0–40.0)

## 2019-01-13 NOTE — Progress Notes (Addendum)
Subjective:    Patient ID: Rhonda Jackson, female    DOB: 06/09/1975, 43 y.o.   MRN: [id number]  HPI  Pt in for cpe/wellness.  Pt since last visit has lost weight on purpose by diet and exercise. Eating healthy.  Pt is social smoker. She states less recently. On and off smoker.  No alcohol.   LMP- Dec 21, 2018.    Pt was jumping on trampoline 2 months ago. Pt fell and hit knee on border of trampoline. She had bruise to left knee. She had pain when walking for a while. But now hurts when she palpates the area. Did not get work up.  Also has some neck pain for one week. Hurts on left side. Pain shoots toward her left shoulder and down her arm.   For knee pain and neck pain only has taken ibuprofen. It did help.   Pt lab pap last year and was normal  Pt last mammogram last year was normal. Pt mom has hx of breast cancer at age 71 yo.  Work going well   Review of Systems  Constitutional: Negative for chills, fatigue and fever.  HENT: Negative for congestion, ear pain, nosebleeds, rhinorrhea, sinus pressure and sinus pain.   Respiratory: Negative for cough, chest tightness and wheezing.   Cardiovascular: Negative for chest pain and palpitations.  Gastrointestinal: Negative for abdominal pain.  Genitourinary: Negative for difficulty urinating, dysuria and urgency.  Musculoskeletal: Positive for neck pain. Negative for back pain.       Left knee pain  Skin: Negative for rash.  Neurological: Negative for dizziness, weakness and headaches.  Hematological: Negative for adenopathy. Does not bruise/bleed easily.  Psychiatric/Behavioral: Negative for behavioral problems, dysphoric mood and sleep disturbance.    Past Medical History:  Diagnosis Date  . Abnormal Pap smear   . Abortion   . AMA (advanced maternal age) multigravida 35+   . History of chlamydia   . Obese   . Tuberculosis    hx +PPD  . Vaginal Pap smear, abnormal    colpo     Social History    Socioeconomic History  . Marital status: Single    Spouse name: Not on file  . Number of children: Not on file  . Years of education: Not on file  . Highest education level: Not on file  Occupational History  . Not on file  Social Needs  . Financial resource strain: Not on file  . Food insecurity    Worry: Not on file    Inability: Not on file  . Transportation needs    Medical: Not on file    Non-medical: Not on file  Tobacco Use  . Smoking status: Light Tobacco Smoker    Types: Cigarettes  . Smokeless tobacco: Never Used  . Tobacco comment: socially  Substance and Sexual Activity  . Alcohol use: Not Currently  . Drug use: No  . Sexual activity: Not Currently  Lifestyle  . Physical activity    Days per week: Not on file    Minutes per session: Not on file  . Stress: Not on file  Relationships  . Social Musician on phone: Not on file    Gets together: Not on file    Attends religious service: Not on file    Active member of club or organization: Not on file    Attends meetings of clubs or organizations: Not on file    Relationship status: Not on file  .  Intimate partner violence    Fear of current or ex partner: Not on file    Emotionally abused: Not on file    Physically abused: Not on file    Forced sexual activity: Not on file  Other Topics Concern  . Not on file  Social History Narrative  . Not on file    Past Surgical History:  Procedure Laterality Date  . CESAREAN SECTION  01/28/2011   Procedure: CESAREAN SECTION;  Surgeon: Guss Bunde, MD;  Location: [facility];  Service: Gynecology;  Laterality: N/A;  . COLPOSCOPY    . DILATION AND CURETTAGE OF UTERUS     tab x2  . WISDOM TOOTH EXTRACTION      Family History  Problem Relation Age of Onset  . Gout Father   . Sleep apnea Father   . Emphysema Father   . Cancer Mother        breast cancer  . Osteoporosis Mother   . Anesthesia problems Neg Hx   . Hypotension Neg Hx   . Malignant  hyperthermia Neg Hx   . Pseudochol deficiency Neg Hx     Allergies  Allergen Reactions  . Gluten Meal Other (See Comments)    Joints swell up  . Naproxen Other (See Comments)    "bad stomach ache"    No current outpatient medications on file prior to visit.   No current facility-administered medications on file prior to visit.     BP (!) 132/52   Pulse 83   Temp (!) 96.5 F (35.8 C) (Temporal)   Resp 16   Ht 5\' 1"  (1.549 m)   Wt 226 lb (102.5 kg)   SpO2 99%   BMI 42.70 kg/m       Objective:   Physical Exam  General Mental Status- Alert. General Appearance- Not in acute distress.   Skin General: Color- Normal Color. Moisture- Normal Moisture.  Neck Carotid Arteries- Normal color. Moisture- Normal Moisture. No carotid bruits. No JVD.  Chest and Lung Exam Auscultation: Breath Sounds:-Normal.  Cardiovascular Auscultation:Rythm- Regular. Murmurs & Other Heart Sounds:Auscultation of the heart reveals- No Murmurs.  Abdomen Inspection:-Inspeection Normal. Palpation/Percussion:Note:No mass. Palpation and Percussion of the abdomen reveal- Non Tender, Non Distended + BS, no rebound or guarding.  Neurologic Cranial Nerve exam:- CN III-XII intact(No nystagmus), symmetric smile. Normal/IntactStrength:- 5/5 equal and symmetric strength both upper and lower extremities.  Left knee- pain on range of motion. Pain on palpation tibial plateau.      Assessment & Plan:  For you wellness exam today I have ordered cbc, cmp and  lipid panel.  Flu vaccine declined.  Recommend exercise and healthy diet.  We will let you know lab results as they come in.  Follow up date appointment will be determined after lab review.    Will get xray for left knee. Otc ibuprofen to use if needed  For neck pain with some radicular pain will get c spine xray.   After knee xray review will decide on referral to specialist to potentially evaluate meniscus.  Counseled on smoking  cessation. Consider use of wellbutrin.  Mammogram order placed.  [place] charge as well. Addressed both knee and neck pain.

## 2019-01-13 NOTE — Patient Instructions (Addendum)
For you wellness exam today I have ordered cbc, cmp and  lipid panel.  Flu vaccine declined.  Recommend exercise and healthy diet.  We will let you know lab results as they come in.  Follow up date appointment will be determined after lab review.   Will get xray for left knee. Otc ibuprofen  to use if needed  For neck pain with some radicular pain will get c spine xray.   After knee xray review will decide on referral to specialist to potentially evaluate meniscus.  Counseled on smoking cessation. Consider use of wellbutrin.  Mammogram order placed.   Preventive Care 40-74 Years Old, Female Preventive care refers to visits with your health care provider and lifestyle choices that can promote health and wellness. This includes:  A yearly physical exam. This may also be called an annual well check.  Regular dental visits and eye exams.  Immunizations.  Screening for certain conditions.  Healthy lifestyle choices, such as eating a healthy diet, getting regular exercise, not using drugs or products that contain nicotine and tobacco, and limiting alcohol use. What can I expect for my preventive care visit? Physical exam Your health care provider will check your:  Height and weight. This may be used to calculate body mass index (BMI), which tells if you are at a healthy weight.  Heart rate and blood pressure.  Skin for abnormal spots. Counseling Your health care provider may ask you questions about your:  Alcohol, tobacco, and drug use.  Emotional well-being.  Home and relationship well-being.  Sexual activity.  Eating habits.  Work and work Statistician.  Method of birth control.  Menstrual cycle.  Pregnancy history. What immunizations do I need?  Influenza (flu) vaccine  This is recommended every year. Tetanus, diphtheria, and pertussis (Tdap) vaccine  You may need a Td booster every 10 years. Varicella (chickenpox) vaccine  You may need this if you  have not been vaccinated. Zoster (shingles) vaccine  You may need this after age 39. Measles, mumps, and rubella (MMR) vaccine  You may need at least one dose of MMR if you were born in 1957 or later. You may also need a second dose. Pneumococcal conjugate (PCV13) vaccine  You may need this if you have certain conditions and were not previously vaccinated. Pneumococcal polysaccharide (PPSV23) vaccine  You may need one or two doses if you smoke cigarettes or if you have certain conditions. Meningococcal conjugate (MenACWY) vaccine  You may need this if you have certain conditions. Hepatitis A vaccine  You may need this if you have certain conditions or if you travel or work in places where you may be exposed to hepatitis A. Hepatitis B vaccine  You may need this if you have certain conditions or if you travel or work in places where you may be exposed to hepatitis B. Haemophilus influenzae type b (Hib) vaccine  You may need this if you have certain conditions. Human papillomavirus (HPV) vaccine  If recommended by your health care provider, you may need three doses over 6 months. You may receive vaccines as individual doses or as more than one vaccine together in one shot (combination vaccines). Talk with your health care provider about the risks and benefits of combination vaccines. What tests do I need? Blood tests  Lipid and cholesterol levels. These may be checked every 5 years, or more frequently if you are over 75 years old.  Hepatitis C test.  Hepatitis B test. Screening  Lung cancer screening. You  may have this screening every year starting at age 42 if you have a 30-pack-year history of smoking and currently smoke or have quit within the past 15 years.  Colorectal cancer screening. All adults should have this screening starting at age 92 and continuing until age 71. Your health care provider may recommend screening at age 53 if you are at increased risk. You will have  tests every 1-10 years, depending on your results and the type of screening test.  Diabetes screening. This is done by checking your blood sugar (glucose) after you have not eaten for a while (fasting). You may have this done every 1-3 years.  Mammogram. This may be done every 1-2 years. Talk with your health care provider about when you should start having regular mammograms. This may depend on whether you have a family history of breast cancer.  BRCA-related cancer screening. This may be done if you have a family history of breast, ovarian, tubal, or peritoneal cancers.  Pelvic exam and Pap test. This may be done every 3 years starting at age 84. Starting at age 40, this may be done every 5 years if you have a Pap test in combination with an HPV test. Other tests  Sexually transmitted disease (STD) testing.  Bone density scan. This is done to screen for osteoporosis. You may have this scan if you are at high risk for osteoporosis. Follow these instructions at home: Eating and drinking  Eat a diet that includes fresh fruits and vegetables, whole grains, lean protein, and low-fat dairy.  Take vitamin and mineral supplements as recommended by your health care provider.  Do not drink alcohol if: ? Your health care provider tells you not to drink. ? You are pregnant, may be pregnant, or are planning to become pregnant.  If you drink alcohol: ? Limit how much you have to 0-1 drink a day. ? Be aware of how much alcohol is in your drink. In the [place] one drink equals one 12 oz bottle of beer (355 mL), one 5 oz glass of wine (148 mL), or one 1 oz glass of hard liquor (44 mL). Lifestyle  Take daily care of your teeth and gums.  Stay active. Exercise for at least 30 minutes on 5 or more days each week.  Do not use any products that contain nicotine or tobacco, such as cigarettes, e-cigarettes, and chewing tobacco. If you need help quitting, ask your health care provider.  If you are  sexually active, practice safe sex. Use a condom or other form of birth control (contraception) in order to prevent pregnancy and STIs (sexually transmitted infections).  If told by your health care provider, take low-dose aspirin daily starting at age 18. What's next?  Visit your health care provider once a year for a well check visit.  Ask your health care provider how often you should have your eyes and teeth checked.  Stay up to date on all vaccines. This information is not intended to replace advice given to you by your health care provider. Make sure you discuss any questions you have with your health care provider. Document Released: 03/09/2015 Document Revised: 10/22/2017 Document Reviewed: 10/22/2017 Elsevier Patient Education  2020 [employer].

## 2019-01-18 ENCOUNTER — Ambulatory Visit: Payer: Self-pay

## 2019-01-18 ENCOUNTER — Other Ambulatory Visit: Payer: Self-pay

## 2019-01-18 ENCOUNTER — Ambulatory Visit (INDEPENDENT_AMBULATORY_CARE_PROVIDER_SITE_OTHER): Payer: Managed Care, Other (non HMO) | Admitting: Medical

## 2019-01-18 ENCOUNTER — Telehealth: Payer: Self-pay

## 2019-01-18 DIAGNOSIS — M25532 Pain in left wrist: Secondary | ICD-10-CM

## 2019-01-18 DIAGNOSIS — M25569 Pain in unspecified knee: Secondary | ICD-10-CM | POA: Diagnosis not present

## 2019-01-18 DIAGNOSIS — M2550 Pain in unspecified joint: Secondary | ICD-10-CM

## 2019-01-18 DIAGNOSIS — M542 Cervicalgia: Secondary | ICD-10-CM | POA: Diagnosis not present

## 2019-01-18 MED ORDER — TRAMADOL HCL 50 MG PO TABS: 50.0000 mg | ORAL_TABLET | Freq: Four times a day (QID) | ORAL | 0 refills | Status: AC | PRN

## 2019-01-18 MED ORDER — PREDNISONE 10 MG PO TABS: ORAL_TABLET | ORAL | 0 refills | Status: DC

## 2019-01-18 NOTE — Telephone Encounter (Signed)
Pt scheduled for appointment today.

## 2019-01-18 NOTE — Telephone Encounter (Signed)
Copied from [place] [id number]. Topic: General - Other >> Jan 18, 2019 11:07 AM Yvette Rack wrote: Reason for CRM: Pt stated she is still experiencing pain and she was told if the pain continues to call and speak with provider. Pt stated she needs to speak with provider regarding imaging results and medication. Pt requests call back. Cb# [phone]

## 2019-01-18 NOTE — Patient Instructions (Signed)
For your knee pain, neck pain, and new left wrist pain, I do think it is best for you to go ahead and start taper dose of prednisone and tramadol.  Did advise discontinue meloxicam or any over-the-counter NSAIDs.  Rx advisement given regarding tramadol class of med.  Put in future order for her to get arthritis panel tomorrow.  If her neck pain and knee pain persist despite above measures then could refer to sports medicine.  This was discussed with patient.  Follow-up in 10 days or as needed.

## 2019-01-18 NOTE — Progress Notes (Signed)
   Subjective:    Patient ID: Rhonda Jackson, female    DOB: [birth year], 43 y.o.   MRN: [id number]  HPI  Virtual Visit via Telephone Note  I connected with Rhonda Jackson on 01/18/19 at  3:20 PM EST by telephone and verified that I am speaking with the correct person using two identifiers.  Location: Patient: home Provider: office   I discussed the limitations, risks, security and privacy concerns of performing an evaluation and management service by telephone and the availability of in person appointments. I also discussed with the patient that there may be a patient responsible charge related to this service. The patient expressed understanding and agreed to proceed.   History of Present Illness: Pt states she still has pain. She has knee pain and some neck pain with radicular pain. Pt states meloxicam did not help so she used ibuprofen 200 mg up to 6 tabs.   Now has left side wrist pain toward her thumb.   See the xray of neck and xray of her knee. Pain in knee when bends over. Pain in her neck is worse.  Pt thinks she might have RA. Pt has had some negative studies in past. But was told inflammatory arthritis. In 2015.     Observations/Objective: General- no acute distress, pleasant. But she is in pain. Normal speech.   Assessment and Plan: For your knee pain, neck pain, and new left wrist pain, I do think it is best for you to go ahead and start taper dose of prednisone and tramadol.  Did advise discontinue meloxicam or any over-the-counter NSAIDs.  Rx advisement given regarding tramadol class of med.  Put in future order for her to get arthritis panel tomorrow.  If her neck pain and knee pain persist despite above measures then could refer to sports medicine.  This was discussed with patient.  Follow-up in 10 days or as needed.  Follow Up Instructions:    I discussed the assessment and treatment plan with the patient. The patient was provided an opportunity to ask  questions and all were answered. The patient agreed with the plan and demonstrated an understanding of the instructions.   The patient was advised to call back or seek an in-person evaluation if the symptoms worsen or if the condition fails to improve as anticipated.  I provided 25 minutes of non-face-to-face time during this encounter.   Mackie Pai, PA-C   Review of Systems     Objective:   Physical Exam        Assessment & Plan:

## 2019-01-18 NOTE — Telephone Encounter (Signed)
Incoming call from Patient stating that the Pharmacy never received the Rx for prednisone to be filled.  Patient received the Ultram but not the prednisone.

## 2019-01-19 ENCOUNTER — Other Ambulatory Visit (INDEPENDENT_AMBULATORY_CARE_PROVIDER_SITE_OTHER): Payer: Managed Care, Other (non HMO)

## 2019-01-19 DIAGNOSIS — M2550 Pain in unspecified joint: Secondary | ICD-10-CM | POA: Diagnosis not present

## 2019-01-19 LAB — SEDIMENTATION RATE: Sed Rate: 25 mm/hr — ABNORMAL HIGH (ref 0–20)

## 2019-01-19 LAB — C-REACTIVE PROTEIN: CRP: <1 mg/dL (ref 0.5–20.0)

## 2019-01-19 MED ORDER — PREDNISONE 10 MG PO TABS: ORAL_TABLET | ORAL | 0 refills | Status: DC

## 2019-01-19 NOTE — Addendum Note (Signed)
Addended by: Anabel Halon on: 01/19/2019 09:11 AM   Modules accepted: Orders

## 2019-01-25 LAB — RHEUMATOID FACTOR: Rheumatoid fact SerPl-aCnc: <14 IU/mL (ref <14)

## 2019-01-25 LAB — ANA: Anti Nuclear Antibody (ANA): NEGATIVE

## 2019-01-25 LAB — HLA-B27 ANTIGEN: HLA-B27 Antigen: NEGATIVE

## 2019-01-26 ENCOUNTER — Ambulatory Visit (HOSPITAL_BASED_OUTPATIENT_CLINIC_OR_DEPARTMENT_OTHER): Payer: Managed Care, Other (non HMO)

## 2019-05-26 ENCOUNTER — Encounter: Payer: Self-pay | Admitting: Medical

## 2019-05-26 ENCOUNTER — Other Ambulatory Visit (HOSPITAL_BASED_OUTPATIENT_CLINIC_OR_DEPARTMENT_OTHER): Payer: Self-pay | Admitting: Medical

## 2019-05-26 DIAGNOSIS — Z1231 Encounter for screening mammogram for malignant neoplasm of breast: Secondary | ICD-10-CM

## 2019-05-27 ENCOUNTER — Ambulatory Visit (HOSPITAL_BASED_OUTPATIENT_CLINIC_OR_DEPARTMENT_OTHER)
Admission: RE | Admit: 2019-05-27 | Discharge: 2019-05-27 | Disposition: A | Discharge status: Home / Self Care | Payer: Managed Care, Other (non HMO) | Source: Ambulatory Visit | Attending: Medical | Admitting: Medical

## 2019-05-27 ENCOUNTER — Other Ambulatory Visit: Payer: Self-pay

## 2019-05-27 DIAGNOSIS — Z1231 Encounter for screening mammogram for malignant neoplasm of breast: Secondary | ICD-10-CM | POA: Insufficient documentation

## 2019-06-09 ENCOUNTER — Ambulatory Visit: Payer: Managed Care, Other (non HMO) | Admitting: Family Medicine

## 2019-06-09 DIAGNOSIS — Z01419 Encounter for gynecological examination (general) (routine) without abnormal findings: Secondary | ICD-10-CM

## 2020-04-05 ENCOUNTER — Emergency Department (HOSPITAL_BASED_OUTPATIENT_CLINIC_OR_DEPARTMENT_OTHER)
Admission: EM | Admit: 2020-04-05 | Discharge: 2020-04-05 | Disposition: A | Discharge status: Home / Self Care | Payer: Managed Care, Other (non HMO) | Attending: Emergency Medicine | Admitting: Emergency Medicine

## 2020-04-05 ENCOUNTER — Encounter (HOSPITAL_BASED_OUTPATIENT_CLINIC_OR_DEPARTMENT_OTHER): Payer: Self-pay | Admitting: Emergency Medicine

## 2020-04-05 ENCOUNTER — Other Ambulatory Visit: Payer: Self-pay

## 2020-04-05 DIAGNOSIS — F17210 Nicotine dependence, cigarettes, uncomplicated: Secondary | ICD-10-CM | POA: Insufficient documentation

## 2020-04-05 DIAGNOSIS — M545 Low back pain, unspecified: Secondary | ICD-10-CM | POA: Insufficient documentation

## 2020-04-05 DIAGNOSIS — R109 Unspecified abdominal pain: Secondary | ICD-10-CM | POA: Insufficient documentation

## 2020-04-05 LAB — PREGNANCY, URINE: Preg Test, Ur: NEGATIVE

## 2020-04-05 MED ORDER — LIDOCAINE 5 % EX PTCH: 1.0000 | MEDICATED_PATCH | CUTANEOUS | 0 refills | Status: DC

## 2020-04-05 MED ORDER — KETOROLAC TROMETHAMINE 30 MG/ML IJ SOLN
30.0000 mg | Freq: Once | INTRAMUSCULAR | Status: AC
  Administered 2020-04-05: 30 mg via INTRAMUSCULAR
  Filled 2020-04-05: qty 1

## 2020-04-05 MED ORDER — CYCLOBENZAPRINE HCL 10 MG PO TABS: 10.0000 mg | ORAL_TABLET | Freq: Three times a day (TID) | ORAL | 0 refills | Status: DC | PRN

## 2020-04-05 MED ORDER — HYDROMORPHONE HCL 1 MG/ML IJ SOLN
2.0000 mg | Freq: Once | INTRAMUSCULAR | Status: AC
  Administered 2020-04-05: 2 mg via INTRAMUSCULAR
  Filled 2020-04-05: qty 2

## 2020-04-05 NOTE — ED Triage Notes (Addendum)
States woke up Sunday with lower back pain and it has gotten progressively worse each day. Denies recent injury . Pain radiates up to middle back on the left . Denies cough or SOB or prior incident of back pain. Currently on menstrual period, does not usually have back pain. Has a icey hot on back at present. Tried NSAIDS and Tylenol , no relief. Pain worse with movement

## 2020-04-05 NOTE — Discharge Instructions (Signed)
If you develop worsening, recurrent, or continued back pain, numbness or weakness in the legs, incontinence of your bowels or bladders, numbness of your buttocks, fever, abdominal pain, or any other new/concerning symptoms then return to the ER for evaluation.  

## 2020-04-05 NOTE — ED Notes (Signed)
ED Provider at bedside. 

## 2020-04-05 NOTE — ED Provider Notes (Signed)
[facility] EMERGENCY DEPARTMENT Provider Note   CSN: [id number] Arrival date & time: 04/05/20  2153     History No chief complaint on file.   Rhonda Jackson is a 45 y.o. female.  HPI 45 year old female presents with acute severe back pain. Started 4 days ago. No obvious trauma. Any type of movement makes it worse. It is left-sided. There is no radiation down her legs or to her abdomen. She is having a little bit of abdominal cramping but she also started her menstrual cycle. No dysuria. No fevers or illicit drug use. No incontinence. Has taken some Tylenol and then also took a meloxicam.   Past Medical History:  Diagnosis Date  . Abnormal Pap smear   . Abortion   . AMA (advanced maternal age) multigravida 35+   . History of chlamydia   . Obese   . Tuberculosis    hx +PPD  . Vaginal Pap smear, abnormal    colpo    Patient Active Problem List   Diagnosis Date Noted  . Plantar fasciitis, right 11/11/2017  . Advanced maternal age in pregnancy 01/01/2011  . Supervision of other normal pregnancy 01/01/2011  . Obesity 01/01/2011    Past Surgical History:  Procedure Laterality Date  . CESAREAN SECTION  01/28/2011   Procedure: CESAREAN SECTION;  Surgeon: [name], MD;  Location: [facility] ORS;  Service: Gynecology;  Laterality: N/A;  . COLPOSCOPY    . DILATION AND CURETTAGE OF UTERUS     tab x2  . WISDOM TOOTH EXTRACTION       OB History    Gravida  8   Para  3   Term  3   Preterm      AB  5   Living  3     SAB  0   IAB  4   Ectopic  1   Multiple      Live Births  3           Family History  Problem Relation Age of Onset  . Gout Father   . Sleep apnea Father   . Emphysema Father   . Cancer Mother        breast cancer  . Osteoporosis Mother   . Anesthesia problems Neg Hx   . Hypotension Neg Hx   . Malignant hyperthermia Neg Hx   . Pseudochol deficiency Neg Hx     Social History   Tobacco Use  . Smoking status: Light  Tobacco Smoker    Types: Cigarettes  . Smokeless tobacco: Never Used  . Tobacco comment: socially  Substance Use Topics  . Alcohol use: Not Currently  . Drug use: No    Home Medications Prior to Admission medications   Medication Sig Start Date End Date Taking? Authorizing Provider  cyclobenzaprine (FLEXERIL) 10 MG tablet Take 1 tablet (10 mg total) by mouth 3 (three) times daily as needed for muscle spasms. 04/05/20  Yes Pricilla Loveless, MD  predniSONE (DELTASONE) 10 MG tablet 6 TAB PO DAY 1 5 TAB PO DAY 2 4 TAB PO DAY 3 3 TAB PO DAY 4 2 TAB PO DAY 5 1 TAB PO DAY 6 01/19/19   Saguier, Ramon Dredge, PA-C    Allergies    Gluten meal and Naproxen  Review of Systems   Review of Systems  Constitutional: Negative for fever.  Gastrointestinal: Negative for abdominal pain.  Genitourinary: Positive for vaginal bleeding. Negative for dysuria.  Musculoskeletal: Positive for back pain.  Neurological: Negative  for weakness and numbness.    Physical Exam Updated Vital Signs BP 125/70 (BP Location: Right Arm)   Pulse 75   Temp 98.4 F (36.9 C) (Oral)   Resp 18   Ht 5\' 1"  (1.549 m)   Wt 101.6 kg   LMP 04/04/2020   SpO2 96%   BMI 42.32 kg/m   Physical Exam Vitals and nursing note reviewed.  Constitutional:      Appearance: She is well-developed and well-nourished.  HENT:     Head: Normocephalic and atraumatic.     Right Ear: External ear normal.     Left Ear: External ear normal.     Nose: Nose normal.  Eyes:     General:        Right eye: No discharge.        Left eye: No discharge.  Cardiovascular:     Rate and Rhythm: Normal rate and regular rhythm.     Heart sounds: Normal heart sounds.  Pulmonary:     Effort: Pulmonary effort is normal.     Breath sounds: Normal breath sounds.  Abdominal:     General: There is no distension.     Palpations: Abdomen is soft.     Tenderness: There is no abdominal tenderness.  Musculoskeletal:     Lumbar back: Tenderness present. No  bony tenderness.       Back:     Comments: Diffuse tenderness to left paraspinal back. No rash  Skin:    General: Skin is warm and dry.  Neurological:     Mental Status: She is alert.     Comments: 5/5 strength in BLE. Able to stand/walk  Psychiatric:        Mood and Affect: Mood is not anxious.     ED Results / Procedures / Treatments   Labs (all labs ordered are listed, but only abnormal results are displayed) Labs Reviewed  PREGNANCY, URINE    EKG None  Radiology No results found.  Procedures Procedures   Medications Ordered in ED Medications  HYDROmorphone (DILAUDID) injection 2 mg (2 mg Intramuscular Given 04/05/20 2243)  ketorolac (TORADOL) 30 MG/ML injection 30 mg (30 mg Intramuscular Given 04/05/20 2245)    ED Course  I have reviewed the triage vital signs and the nursing notes.  Pertinent labs & imaging results that were available during my care of the patient were reviewed by me and considered in my medical decision making (see chart for details).    MDM Rules/Calculators/A&P                          Patient has no red flags for acute spinal emergency.  My suspicion of intra-abdominal/retroperitoneal emergency is pretty low.  She is not pregnant.  Given IM Dilaudid and IM Toradol.  Continue NSAIDs and Tylenol at home and will prescribe short course of muscle relaxers.  We will also give lidoderm. Follow up with PCP. We discussed return precautions.  Final Clinical Impression(s) / ED Diagnoses Final diagnoses:  Acute right-sided low back pain without sciatica    Rx / DC Orders ED Discharge Orders         Ordered    cyclobenzaprine (FLEXERIL) 10 MG tablet  3 times daily PRN        04/05/20 2306           [id number], MD 04/05/20 2308

## 2020-11-17 ENCOUNTER — Encounter (HOSPITAL_BASED_OUTPATIENT_CLINIC_OR_DEPARTMENT_OTHER): Payer: Self-pay | Admitting: *Deleted

## 2020-11-17 ENCOUNTER — Emergency Department (HOSPITAL_BASED_OUTPATIENT_CLINIC_OR_DEPARTMENT_OTHER)
Admission: EM | Admit: 2020-11-17 | Discharge: 2020-11-17 | Disposition: A | Discharge status: Home / Self Care | Payer: Self-pay | Attending: Emergency Medicine | Admitting: Emergency Medicine

## 2020-11-17 ENCOUNTER — Emergency Department (HOSPITAL_BASED_OUTPATIENT_CLINIC_OR_DEPARTMENT_OTHER): Payer: Self-pay

## 2020-11-17 ENCOUNTER — Other Ambulatory Visit: Payer: Self-pay

## 2020-11-17 DIAGNOSIS — R891 Abnormal level of hormones in specimens from other organs, systems and tissues: Secondary | ICD-10-CM | POA: Insufficient documentation

## 2020-11-17 DIAGNOSIS — F17210 Nicotine dependence, cigarettes, uncomplicated: Secondary | ICD-10-CM | POA: Insufficient documentation

## 2020-11-17 DIAGNOSIS — R42 Dizziness and giddiness: Secondary | ICD-10-CM | POA: Insufficient documentation

## 2020-11-17 DIAGNOSIS — N3090 Cystitis, unspecified without hematuria: Secondary | ICD-10-CM | POA: Insufficient documentation

## 2020-11-17 DIAGNOSIS — R110 Nausea: Secondary | ICD-10-CM | POA: Insufficient documentation

## 2020-11-17 DIAGNOSIS — N9489 Other specified conditions associated with female genital organs and menstrual cycle: Secondary | ICD-10-CM | POA: Insufficient documentation

## 2020-11-17 DIAGNOSIS — E349 Endocrine disorder, unspecified: Secondary | ICD-10-CM

## 2020-11-17 LAB — CBC WITH DIFFERENTIAL/PLATELET
Abs Immature Granulocytes: 0.05 10*3/uL (ref 0.00–0.07)
Basophils Absolute: 0.1 10*3/uL (ref 0.0–0.1)
Basophils Relative: 1 %
Eosinophils Absolute: 0.2 10*3/uL (ref 0.0–0.5)
Eosinophils Relative: 2 %
HCT: 39.6 % (ref 36.0–46.0)
Hemoglobin: 13 g/dL (ref 12.0–15.0)
Immature Granulocytes: 1 %
Lymphocytes Relative: 34 %
Lymphs Abs: 3.3 10*3/uL (ref 0.7–4.0)
MCH: 28.3 pg (ref 26.0–34.0)
MCHC: 32.8 g/dL (ref 30.0–36.0)
MCV: 86.3 fL (ref 80.0–100.0)
Monocytes Absolute: 0.5 10*3/uL (ref 0.1–1.0)
Monocytes Relative: 6 %
Neutro Abs: 5.5 10*3/uL (ref 1.7–7.7)
Neutrophils Relative %: 56 %
Platelets: 217 10*3/uL (ref 150–400)
RBC: 4.59 MIL/uL (ref 3.87–5.11)
RDW: 13.5 % (ref 11.5–15.5)
WBC: 9.6 10*3/uL (ref 4.0–10.5)
nRBC: 0 % (ref 0.0–0.2)

## 2020-11-17 LAB — URINALYSIS, COMPLETE (UACMP) WITH MICROSCOPIC
Bilirubin Urine: NEGATIVE
Glucose, UA: NEGATIVE mg/dL
Ketones, ur: NEGATIVE mg/dL
Leukocytes,Ua: NEGATIVE
Nitrite: NEGATIVE
Protein, ur: NEGATIVE mg/dL
Specific Gravity, Urine: 1.025 (ref 1.005–1.030)
pH: 6 (ref 5.0–8.0)

## 2020-11-17 LAB — COMPREHENSIVE METABOLIC PANEL
ALT: 15 U/L (ref 0–44)
AST: 15 U/L (ref 15–41)
Albumin: 4 g/dL (ref 3.5–5.0)
Alkaline Phosphatase: 54 U/L (ref 38–126)
Anion gap: 6 (ref 5–15)
BUN: 14 mg/dL (ref 6–20)
CO2: 25 mmol/L (ref 22–32)
Calcium: 8.7 mg/dL — ABNORMAL LOW (ref 8.9–10.3)
Chloride: 104 mmol/L (ref 98–111)
Creatinine, Ser: 0.71 mg/dL (ref 0.44–1.00)
GFR, Estimated: >60 mL/min (ref >60)
Glucose, Bld: 75 mg/dL (ref 70–99)
Potassium: 3.7 mmol/L (ref 3.5–5.1)
Sodium: 135 mmol/L (ref 135–145)
Total Bilirubin: 0.2 mg/dL — ABNORMAL LOW (ref 0.3–1.2)
Total Protein: 7.4 g/dL (ref 6.5–8.1)

## 2020-11-17 LAB — HCG, QUANTITATIVE, PREGNANCY: hCG, Beta Chain, Quant, S: 37 m[IU]/mL — ABNORMAL HIGH (ref <5)

## 2020-11-17 LAB — PREGNANCY, URINE: Preg Test, Ur: NEGATIVE

## 2020-11-17 MED ORDER — SODIUM CHLORIDE 0.9 % IV BOLUS
1000.0000 mL | Freq: Once | INTRAVENOUS | Status: AC
  Administered 2020-11-17: 1000 mL via INTRAVENOUS

## 2020-11-17 MED ORDER — CEPHALEXIN 500 MG PO CAPS: 500.0000 mg | ORAL_CAPSULE | Freq: Two times a day (BID) | ORAL | 0 refills | Status: DC

## 2020-11-17 MED ORDER — SODIUM CHLORIDE 0.9 % IV SOLN
1.0000 g | Freq: Once | INTRAVENOUS | Status: AC
  Administered 2020-11-17: 1 g via INTRAVENOUS
  Filled 2020-11-17: qty 10

## 2020-11-17 NOTE — Discharge Instructions (Addendum)
Follow-up in 2 days for your repeat beta-hCG check, they may consider repeat ultrasound at this time.  I for the contact information for [facility] that we normally refer to.  You can follow-up with her OB/GYN if it is easier for you.  Please take the entire course of antibiotics I prescribed for you.  You can use Tylenol for pain until you are sure about your pregnancy status, after which you can use ibuprofen as well.  Of your pain worsens or fails to improve including if you pass out, become extremely lightheaded, or begin to have significant vaginal bleeding.

## 2020-11-17 NOTE — ED Provider Notes (Signed)
[facility] EMERGENCY DEPARTMENT Provider Note   CSN: [id number] Arrival date & time: 11/17/20  1549     History Chief Complaint  Patient presents with   Abdominal Pain    Rhonda Jackson is a 45 y.o. female with a past medical history of ectopic pregnancy with negative urine pregnancy test who presents with 3 days of right groin/flank pain.  Patient reports the pain is sharp, colicky in nature, associated with some dizziness and nausea.  Patient denies any vomiting.  Patient denies vaginal bleeding, vaginal discharge.  Patient denies dysuria, does endorse some urinary frequency, but does report that she is increased her fluid intake recently.  Patient denies personal history of kidney stones.  Patient has not had a fever, does endorse some hot flashes that she associated with potential getting a menopause.  Patient does not have any abdominal pain, diarrhea, constipation.  Patient has no chest pain, shortness of breath.  Patient has no lower leg edema.   Abdominal Pain Associated symptoms: nausea   Associated symptoms: no constipation, no diarrhea and no vomiting       Past Medical History:  Diagnosis Date   Abnormal Pap smear    Abortion    AMA (advanced maternal age) multigravida 35+    History of chlamydia    Obese    Tuberculosis    hx +PPD   Vaginal Pap smear, abnormal    colpo    Patient Active Problem List   Diagnosis Date Noted   Plantar fasciitis, right 11/11/2017   Advanced maternal age in pregnancy 01/01/2011   Supervision of other normal pregnancy 01/01/2011   Obesity 01/01/2011    Past Surgical History:  Procedure Laterality Date   CESAREAN SECTION  01/28/2011   Procedure: CESAREAN SECTION;  Surgeon: [name], MD;  Location: [facility];  Service: Gynecology;  Laterality: N/A;   COLPOSCOPY     DILATION AND CURETTAGE OF UTERUS     tab x2   WISDOM TOOTH EXTRACTION       OB History     Gravida  8   Para  3   Term  3   Preterm       AB  5   Living  3      SAB  0   IAB  4   Ectopic  1   Multiple      Live Births  3           Family History  Problem Relation Age of Onset   Gout Father    Sleep apnea Father    Emphysema Father    Cancer Mother        breast cancer   Osteoporosis Mother    Anesthesia problems Neg Hx    Hypotension Neg Hx    Malignant hyperthermia Neg Hx    Pseudochol deficiency Neg Hx     Social History   Tobacco Use   Smoking status: Light Smoker    Types: Cigarettes   Smokeless tobacco: Never   Tobacco comments:    socially  Substance Use Topics   Alcohol use: Not Currently   Drug use: No    Home Medications Prior to Admission medications   Medication Sig Start Date End Date Taking? Authorizing Provider  cephALEXin (KEFLEX) 500 MG capsule Take 1 capsule (500 mg total) by mouth 2 (two) times daily. 11/17/20  Yes Tryce Surratt H, PA-C  cyclobenzaprine (FLEXERIL) 10 MG tablet Take 1 tablet (10 mg total) by mouth  3 (three) times daily as needed for muscle spasms. Patient not taking: Reported on 11/17/2020 04/05/20   Pricilla Loveless, MD    Allergies    Gluten meal and Naproxen  Review of Systems   Review of Systems  Gastrointestinal:  Positive for nausea. Negative for abdominal pain, constipation, diarrhea and vomiting.  Genitourinary:  Positive for flank pain and frequency.  All other systems reviewed and are negative.  Physical Exam Updated Vital Signs BP (!) 105/59   Pulse 80   Temp 98.3 F (36.8 C) (Oral)   Resp 16   Ht 5' 1.5" (1.562 m)   Wt 90.7 kg   LMP 10/16/2020 (Exact Date) Comment: neg upreg today 11/17/2020  SpO2 100%   BMI 37.18 kg/m   Physical Exam Vitals and nursing note reviewed.  Constitutional:      General: She is not in acute distress.    Appearance: Normal appearance.  HENT:     Head: Normocephalic and atraumatic.  Eyes:     General:        Right eye: No discharge.        Left eye: No discharge.  Cardiovascular:      Rate and Rhythm: Normal rate and regular rhythm.     Heart sounds: No murmur heard.   No friction rub. No gallop.  Pulmonary:     Effort: Pulmonary effort is normal.     Breath sounds: Normal breath sounds.  Abdominal:     General: Bowel sounds are normal.     Palpations: Abdomen is soft.     Comments: Positive CVA tenderness on right, negative on left.  Tenderness to palpation right suprapubic region.  No tenderness to palpation throughout rest of abdomen.  Negative Murphy sign, negative McBurney's.  No rebound, guarding, rigidity throughout abdomen.  Skin:    General: Skin is warm and dry.     Capillary Refill: Capillary refill takes less than 2 seconds.  Neurological:     Mental Status: She is alert and oriented to person, place, and time.  Psychiatric:        Mood and Affect: Mood normal.        Behavior: Behavior normal.    ED Results / Procedures / Treatments   Labs (all labs ordered are listed, but only abnormal results are displayed) Labs Reviewed  URINALYSIS, COMPLETE (UACMP) WITH MICROSCOPIC - Abnormal; Notable for the following components:      Result Value   Hgb urine dipstick MODERATE (*)    Bacteria, UA MANY (*)    All other components within normal limits  HCG, QUANTITATIVE, PREGNANCY - Abnormal; Notable for the following components:   hCG, Beta Chain, Quant, S 37 (*)    All other components within normal limits  COMPREHENSIVE METABOLIC PANEL - Abnormal; Notable for the following components:   Calcium 8.7 (*)    Total Bilirubin 0.2 (*)    All other components within normal limits  PREGNANCY, URINE  CBC WITH DIFFERENTIAL/PLATELET    EKG None  Radiology CT Renal Stone Study  Result Date: 11/17/2020 CLINICAL DATA:  Right flank pain. EXAM: CT ABDOMEN AND PELVIS WITHOUT CONTRAST TECHNIQUE: Multidetector CT imaging of the abdomen and pelvis was performed following the standard protocol without IV contrast. COMPARISON:  None. FINDINGS: Lower chest: No acute  abnormality. Hepatobiliary: No gallstones or biliary dilatation is noted. Right hepatic cyst is noted. Pancreas: Unremarkable. No pancreatic ductal dilatation or surrounding inflammatory changes. Spleen: Normal in size without focal abnormality. Adrenals/Urinary Tract: Adrenal glands  are unremarkable. Kidneys are normal, without renal calculi, focal lesion, or hydronephrosis. Bladder is unremarkable. Stomach/Bowel: Stomach is within normal limits. Appendix appears normal. No evidence of bowel wall thickening, distention, or inflammatory changes. Vascular/Lymphatic: No significant vascular findings are present. No enlarged abdominal or pelvic lymph nodes. Reproductive: Uterus and bilateral adnexa are unremarkable. Other: No abdominal wall hernia or abnormality. No abdominopelvic ascites. Musculoskeletal: No acute or significant osseous findings. IMPRESSION: No abnormality seen in the abdomen or pelvis. Electronically Signed   By: Lupita Raider M.D.   On: 11/17/2020 18:01   US OB LESS THAN 14 WEEKS WITH OB TRANSVAGINAL  Result Date: 11/17/2020 CLINICAL DATA:  Right lower quadrant pain EXAM: OBSTETRIC <14 WK [place] AND TRANSVAGINAL OB US TECHNIQUE: Both transabdominal and transvaginal ultrasound examinations were performed for complete evaluation of the gestation as well as the maternal uterus, adnexal regions, and pelvic cul-de-sac. Transvaginal technique was performed to assess early pregnancy. COMPARISON:  None. FINDINGS: Intrauterine gestational sac: None Yolk sac:  Not Visualized. Embryo:  Not Visualized. Maternal uterus/adnexae: Right ovary measures 2.3 x 2.4 x 2.1 cm. The left ovary is nonvisualized. No significant free fluid IMPRESSION: No IUP identified. Findings consistent with pregnancy of unknown location, differential of which includes IUP too early to visualize, recent failed pregnancy, and occult ectopic pregnancy. Recommend trending of HCG with repeat ultrasound as indicated Electronically Signed    By: Jasmine Pang M.D.   On: 11/17/2020 20:20    Procedures Procedures   Medications Ordered in ED Medications  cefTRIAXone (ROCEPHIN) 1 g in sodium chloride 0.9 % 100 mL IVPB (0 g Intravenous Stopped 11/17/20 1838)  sodium chloride 0.9 % bolus 1,000 mL (0 mLs Intravenous Stopped 11/17/20 1903)    ED Course  I have reviewed the triage vital signs and the nursing notes.  Pertinent labs & imaging results that were available during my care of the patient were reviewed by me and considered in my medical decision making (see chart for details).    MDM Rules/Calculators/A&P                         I discussed this case with my attending physician who cosigned this note including patient's presenting symptoms, physical exam, and planned diagnostics and interventions. Attending physician stated agreement with plan or made changes to plan which were implemented.   Initial urinalysis plus patient symptomatology consistent with urinary tract infection.  CT renal stone negative, no evidence of hydronephrosis.  No evidence of acute kidney injury at this time.  Patient with history of ectopic pregnancy with negative urine pregnancy.  Quantitative hCG obtained at this time.  Quantitative hCG comes back at 37, suggestive of potential early pregnancy.  Will obtain transvaginal ultrasound to rule out ectopic at this time.  Patient to follow-up for serial hCG in 2 days.  Otherwise we will treat with Keflex, and recommend follow-up if symptoms worsen or fail to improve.  TVUS is significant for no evidence of ectopic, no evidence of intrauterine pregnancy at this time.  To follow-up for serial hCG in 2 days as discussed above.  We will treat for urinary tract infection at this time given symptoms.  Patient given intravenous Rocephin prior to discharge.  Patient sent home with prescription for Keflex.  Return precautions given.  Patient discharged in stable condition. Final Clinical Impression(s) / ED  Diagnoses Final diagnoses:  Elevated serum hCG  Cystitis    Rx / DC Orders ED Discharge Orders  Ordered    cephALEXin (KEFLEX) 500 MG capsule  2 times daily        11/17/20 2055             [place] 11/17/20 2108    Charlynne Pander, MD 11/18/20 [phone]

## 2020-11-17 NOTE — ED Triage Notes (Addendum)
Pt reports right groin/flank pain x 3 days. Home pregnancy test 'was neither positive or negative' Denies vaginal bleeding.  Reports vaginal discharge.  LMP 10/16/2020

## 2020-11-19 ENCOUNTER — Other Ambulatory Visit: Payer: Self-pay

## 2020-11-19 ENCOUNTER — Encounter: Payer: Self-pay | Admitting: Family Medicine

## 2020-11-19 ENCOUNTER — Ambulatory Visit (INDEPENDENT_AMBULATORY_CARE_PROVIDER_SITE_OTHER): Payer: Self-pay | Admitting: Family Medicine

## 2020-11-19 VITALS — BP 130/84 | HR 79 | Temp 99.1°F | Ht 61.5 in | Wt 232.4 lb

## 2020-11-19 DIAGNOSIS — N926 Irregular menstruation, unspecified: Secondary | ICD-10-CM

## 2020-11-19 NOTE — Patient Instructions (Addendum)
Give [place] 2-3 business days to get the results of your labs back.   Take a prenatal vitamin until we know more.   Let us know if you need anything.

## 2020-11-19 NOTE — Progress Notes (Signed)
Chief Complaint  Patient presents with   Hospitalization Follow-up    ER    Subjective: Patient is a 45 y.o. female here for ER f/u.  The patient was in the ER on 9/24 and diagnosed with elevated serum HCG and acute cystitis.  She was started on Keflex.  She is to follow-up in 2 days for a repeat hCG test.  Her last cycle was 8/23.  She has taken 2 home pregnancy tests that were equivocal to her showing a faint line over the past.  She is sexually active.  She does have lower abdominal pain but questions whether this is related to the UTI as things are improving since starting treatment.  She is not having any fevers, nausea, vomiting, or vaginal bleeding.  She does have a history of an ectopic pregnancy.  Past Medical History:  Diagnosis Date   Abnormal Pap smear    Abortion    AMA (advanced maternal age) multigravida 35+    History of chlamydia    Obese    Tuberculosis    hx +PPD   Vaginal Pap smear, abnormal    colpo    Objective: BP 130/84   Pulse 79   Temp 99.1 F (37.3 C) (Oral)   Ht 5' 1.5" (1.562 m)   Wt 232 lb 6 oz (105.4 kg)   SpO2 99%   BMI 43.20 kg/m  General: Awake, appears stated age HEENT: MMM Heart: RRR Abdomen: Bowel sounds present, soft, mildly tender to palpation of the suprapubic region MSK: Negative Lloyd sign bilaterally Lungs: CTAB, no rales, wheezes or rhonchi. No accessory muscle use Psych: Age appropriate judgment and insight, normal affect and mood  Assessment and Plan: Missed period - Plan: B-HCG Quant  New problem, uncertain prognosis.  If near the same levels are decreased, we will have her follow-up with gynecology for possible missed abortion.  If increased, we will still have her follow-up with obstetrics to manage possible pregnancy.  Recommended she start taking a daily prenatal vitamin.  She is not on any routine medications that are teratogenic.  Follow-up as originally scheduled with her regular PCP. The patient voiced understanding  and agreement to the plan.  Jilda Roche [place], DO 11/19/20  4:33 PM

## 2020-11-20 ENCOUNTER — Other Ambulatory Visit: Payer: Self-pay | Admitting: Family Medicine

## 2020-11-20 DIAGNOSIS — O09521 Supervision of elderly multigravida, first trimester: Secondary | ICD-10-CM

## 2020-11-20 LAB — HCG, QUANTITATIVE, PREGNANCY: Quantitative HCG: 68.39 m[IU]/mL

## 2020-11-21 ENCOUNTER — Ambulatory Visit: Payer: Self-pay | Admitting: Medical

## 2020-12-06 ENCOUNTER — Encounter: Payer: Self-pay | Admitting: Family Medicine

## 2020-12-06 ENCOUNTER — Ambulatory Visit (INDEPENDENT_AMBULATORY_CARE_PROVIDER_SITE_OTHER): Payer: Self-pay | Admitting: Family Medicine

## 2020-12-06 ENCOUNTER — Other Ambulatory Visit (HOSPITAL_COMMUNITY)
Admission: RE | Admit: 2020-12-06 | Discharge: 2020-12-06 | Disposition: A | Discharge status: Home / Self Care | Payer: Self-pay | Source: Ambulatory Visit | Attending: Family Medicine | Admitting: Family Medicine

## 2020-12-06 ENCOUNTER — Other Ambulatory Visit: Payer: Self-pay

## 2020-12-06 VITALS — BP 118/68 | HR 74 | Ht 61.5 in | Wt 233.0 lb

## 2020-12-06 DIAGNOSIS — O0991 Supervision of high risk pregnancy, unspecified, first trimester: Secondary | ICD-10-CM

## 2020-12-06 DIAGNOSIS — Z124 Encounter for screening for malignant neoplasm of cervix: Secondary | ICD-10-CM | POA: Insufficient documentation

## 2020-12-06 DIAGNOSIS — O0990 Supervision of high risk pregnancy, unspecified, unspecified trimester: Secondary | ICD-10-CM

## 2020-12-06 DIAGNOSIS — Z3A01 Less than 8 weeks gestation of pregnancy: Secondary | ICD-10-CM

## 2020-12-06 DIAGNOSIS — O34219 Maternal care for unspecified type scar from previous cesarean delivery: Secondary | ICD-10-CM

## 2020-12-06 DIAGNOSIS — O09521 Supervision of elderly multigravida, first trimester: Secondary | ICD-10-CM

## 2020-12-06 NOTE — Progress Notes (Signed)
DATING AND VIABILITY SONOGRAM   Rhonda Jackson is a 45 y.o. year old [phone] with LMP Patient's last menstrual period was 10/15/2020. which would correlate to  [redacted]w[redacted]d weeks gestation.  She has regular menstrual cycles.   She is here today for a confirmatory initial sonogram.    GESTATION:   FETAL ACTIVITY:          Heart rate     not visible    ADNEXA: The ovaries appear normal.   GESTATIONAL AGE AND  BIOMETRICS:  Gestational criteria: Estimated Date of Delivery: 08/03/21 by early ultrasound now at [redacted]w[redacted]d  Previous Scans:1  GESTATIONAL SAC         0.87cm       5-5 weeks  CROWN RUMP LENGTH           N/a                                                                                      AVERAGE EGA(BY THIS SCAN):  5-5 weeks  WORKING EDD( early ultrasound ):  08-03-21     TECHNICIAN COMMENTS:  Patient informed that the ultrasound is considered a limited obstetric ultrasound and is not intended to be a complete ultrasound exam.  Patient also informed that the ultrasound is not being completed with the intent of assessing for fetal or placental anomalies or any pelvic abnormalities. Explained that the purpose of today's ultrasound is to assess for fetal heart rate.  Patient acknowledges the purpose of the exam and the limitations of the study.     Armandina Stammer 12/06/2020 2:28 PM

## 2020-12-06 NOTE — Progress Notes (Signed)
Subjective:   Rhonda Jackson is a 45 y.o. [id number] at [redacted]w[redacted]d by early ultrasound being seen today for her first obstetrical visit.  Her obstetrical history is significant for advanced maternal age, obesity, and previous cesarean section . Patient does intend to breast feed. Pregnancy history fully reviewed.  Patient reports no complaints.  HISTORY: OB History  Gravida Para Term Preterm AB Living  9 3 3  0 5 3  SAB IAB Ectopic Multiple Live Births  0 4 1 0 3    # Outcome Date GA Lbr Len/2nd Weight Sex Delivery Anes PTL Lv  9 Current           8 IAB 2019          7 Term 01/28/11 [redacted]w[redacted]d 65:15 / 02:22 7 lb 9.7 oz (3.45 kg) F CS-LTranv Spinal  LIV     Name: Rhonda Jackson     Apgar1: 9  Apgar5: 10  6 Ectopic 2011          5 IAB 2006          4 IAB 2001          3 Term 05/20/98 [redacted]w[redacted]d   M Vag-Spont None  LIV     Birth Comments: low aminotic fluid, son has autism  2 IAB 06/24/95             Birth Comments: no complications- was given Demerol for the procedure  1 Term 1996 [redacted]w[redacted]d 08:00 6 lb 10 oz (3.005 kg) F Vag-Spont None  LIV   Past Medical History:  Diagnosis Date   Abnormal Pap smear    Abortion    AMA (advanced maternal age) multigravida 35+    History of chlamydia    Obese    Tuberculosis    hx +PPD   Vaginal Pap smear, abnormal    colpo   Past Surgical History:  Procedure Laterality Date   CESAREAN SECTION  01/28/2011   Procedure: CESAREAN SECTION;  Surgeon: 14/05/2010, MD;  Location: [facility];  Service: Gynecology;  Laterality: N/A;   COLPOSCOPY     DILATION AND CURETTAGE OF UTERUS     tab x2   WISDOM TOOTH EXTRACTION     Family History  Problem Relation Age of Onset   Gout Father    Sleep apnea Father    Emphysema Father    Cancer Mother        breast cancer   Osteoporosis Mother    Anesthesia problems Neg Hx    Hypotension Neg Hx    Malignant hyperthermia Neg Hx    Pseudochol deficiency Neg Hx    Social History   Tobacco Use   Smoking  status: Former    Types: Cigarettes   Smokeless tobacco: Never   Tobacco comments:    socially  Rhonda Jackson   Vaping Use: Never used  Substance Use Topics   Alcohol use: Not Currently   Drug use: No   Allergies  Allergen Reactions   Gluten Meal Other (See Comments)    Joints swell up   Naproxen Other (See Comments)    "bad stomach ache"   Current Outpatient Medications on File Prior to Visit  Medication Sig Dispense Refill   Prenatal Vit-Fe Fumarate-FA (PRENATAL VITAMIN PO) Take by mouth.     cephALEXin (KEFLEX) 500 MG capsule Take 1 capsule (500 mg total) by mouth 2 (two) times daily. (Patient not taking: No sig reported) 14 capsule 0   No current facility-administered  medications on file prior to visit.     Exam   Vitals:   12/06/20 1349  BP: 118/68  Pulse: 74  Weight: 233 lb (105.7 kg)  Height: 5' 1.5" (1.562 m)      Uterus:     Pelvic Exam: Perineum: no hemorrhoids, normal perineum   Vulva: normal external genitalia, no lesions   Vagina:  normal mucosa, normal discharge   Cervix: no lesions and normal, pap smear done.    Adnexa: normal adnexa and no mass, fullness, tenderness   Bony Pelvis: average  System: General: well-developed, well-nourished female in no acute distress   Breast:  normal appearance, no masses or tenderness   Skin: normal coloration and turgor, no rashes   Neurologic: oriented, normal, negative, normal mood   Extremities: normal strength, tone, and muscle mass, ROM of all joints is normal   HEENT PERRLA, extraocular movement intact and sclera clear, anicteric   Mouth/Teeth mucous membranes moist, pharynx normal without lesions and dental hygiene good   Neck supple and no masses   Cardiovascular: regular rate and rhythm   Respiratory:  no respiratory distress, normal breath sounds   Abdomen: soft, non-tender; bowel sounds normal; no masses,  no organomegaly     Assessment:   Pregnancy: [id number] Patient Active Problem List   Diagnosis  Date Noted   Plantar fasciitis, right 11/11/2017   AMA (advanced maternal age) multigravida 35+ 01/01/2011   Supervision of high risk pregnancy, antepartum 01/01/2011   Obesity 01/01/2011     Plan:  1. Encounter for Papanicolaou smear for cervical cancer screening - Cytology - PAP( [place])  2. Supervision of high risk pregnancy, antepartum U/s shows inconclusive viability--f/u in 2 wks Labs with viable u/s No bleeding.  3. Multigravida of advanced maternal age in first trimester Discussed NIPT vs. amnio   Initial labs drawn. Continue prenatal vitamins. Genetic Screening discussed, NIPS:  discussed as option at later [place] . Ultrasound discussed; fetal anatomic survey:  once we ensure viable pregnancy . Problem list reviewed and updated. The nature of [facility] with multiple MDs and other Advanced Practice Providers was explained to patient; also emphasized that residents, students are part of our team. Routine obstetric precautions reviewed. Return in about 2 weeks (around 12/20/2020) for needs U/S.  Future Appointments  Date Time Provider Department Center  01/03/2021  1:10 PM Levie Heritage, DO [facility] None

## 2020-12-06 NOTE — Patient Instructions (Signed)

## 2020-12-10 LAB — CYTOLOGY - PAP
Chlamydia: NEGATIVE
Comment: NEGATIVE
Comment: NEGATIVE
Comment: NORMAL
Diagnosis: NEGATIVE
High risk HPV: NEGATIVE
Neisseria Gonorrhea: NEGATIVE

## 2020-12-17 ENCOUNTER — Inpatient Hospital Stay (HOSPITAL_COMMUNITY)
Admission: AD | Admit: 2020-12-17 | Discharge: 2020-12-17 | Disposition: A | Discharge status: Home / Self Care | Payer: Self-pay | Attending: Family Medicine | Admitting: Family Medicine

## 2020-12-17 ENCOUNTER — Encounter (HOSPITAL_COMMUNITY): Payer: Self-pay | Admitting: Obstetrics & Gynecology

## 2020-12-17 ENCOUNTER — Telehealth: Payer: Self-pay

## 2020-12-17 ENCOUNTER — Inpatient Hospital Stay (HOSPITAL_COMMUNITY): Payer: Self-pay

## 2020-12-17 DIAGNOSIS — Z87891 Personal history of nicotine dependence: Secondary | ICD-10-CM | POA: Insufficient documentation

## 2020-12-17 DIAGNOSIS — O3680X0 Pregnancy with inconclusive fetal viability, not applicable or unspecified: Secondary | ICD-10-CM

## 2020-12-17 DIAGNOSIS — O34219 Maternal care for unspecified type scar from previous cesarean delivery: Secondary | ICD-10-CM | POA: Insufficient documentation

## 2020-12-17 DIAGNOSIS — Z3A01 Less than 8 weeks gestation of pregnancy: Secondary | ICD-10-CM | POA: Insufficient documentation

## 2020-12-17 DIAGNOSIS — O4690 Antepartum hemorrhage, unspecified, unspecified trimester: Secondary | ICD-10-CM

## 2020-12-17 DIAGNOSIS — Z6790 Unspecified blood type, Rh positive: Secondary | ICD-10-CM

## 2020-12-17 DIAGNOSIS — O209 Hemorrhage in early pregnancy, unspecified: Secondary | ICD-10-CM | POA: Insufficient documentation

## 2020-12-17 LAB — CBC
HCT: 36.8 % (ref 36.0–46.0)
Hemoglobin: 11.7 g/dL — ABNORMAL LOW (ref 12.0–15.0)
MCH: 27.6 pg (ref 26.0–34.0)
MCHC: 31.8 g/dL (ref 30.0–36.0)
MCV: 86.8 fL (ref 80.0–100.0)
Platelets: 252 10*3/uL (ref 150–400)
RBC: 4.24 MIL/uL (ref 3.87–5.11)
RDW: 13.5 % (ref 11.5–15.5)
WBC: 8 10*3/uL (ref 4.0–10.5)
nRBC: 0 % (ref 0.0–0.2)

## 2020-12-17 LAB — WET PREP, GENITAL
Clue Cells Wet Prep HPF POC: NONE SEEN
Sperm: NONE SEEN
Trich, Wet Prep: NONE SEEN
Yeast Wet Prep HPF POC: NONE SEEN

## 2020-12-17 LAB — HCG, QUANTITATIVE, PREGNANCY: hCG, Beta Chain, Quant, S: 2255 m[IU]/mL — ABNORMAL HIGH (ref <5)

## 2020-12-17 NOTE — Telephone Encounter (Signed)
Pt called stating she thinks she is having a miscarriage. Pt states she started spotting over the weekend and now she is having heavy bleeding and pain.Pt advised pt to go to [facility] at [facility] at [address] [facility] Entrance C. Understanding was voiced. Kaysan Peixoto l Camyra Vaeth, CMA

## 2020-12-17 NOTE — MAU Note (Signed)
Pt reports she started spotting 3 days ago . Bleeding got heavier and she is having increased ab/ back  pain.

## 2020-12-17 NOTE — MAU Provider Note (Signed)
History     CSN: [id number]  Arrival date and time: 12/17/20 1454   Event Date/Time   First Provider Initiated Contact with Patient 12/17/20 1544      Chief Complaint  Patient presents with   Vaginal Bleeding   44 y.o. [id number] @[redacted]w[redacted]d  presenting with VB and cramping. Spotting started 3 days ago and became heavier today with clots. Endorses cramping in lower abdomen. Rates 9/10. She took Tylenol which helped some. Reports IC prior to bleeding started.   OB History     Gravida  9   Para  3   Term  3   Preterm      AB  5   Living  3      SAB  0   IAB  4   Ectopic  1   Multiple      Live Births  3           Past Medical History:  Diagnosis Date   Abnormal Pap smear    Abortion    AMA (advanced maternal age) multigravida 35+    History of chlamydia    Obese    Tuberculosis    hx +PPD   Vaginal Pap smear, abnormal    colpo    Past Surgical History:  Procedure Laterality Date   CESAREAN SECTION  01/28/2011   Procedure: CESAREAN SECTION;  Surgeon: 14/05/2010, MD;  Location: [facility];  Service: Gynecology;  Laterality: N/A;   COLPOSCOPY     DILATION AND CURETTAGE OF UTERUS     tab x2   WISDOM TOOTH EXTRACTION      Family History  Problem Relation Age of Onset   Gout Father    Sleep apnea Father    Emphysema Father    Cancer Mother        breast cancer   Osteoporosis Mother    Anesthesia problems Neg Hx    Hypotension Neg Hx    Malignant hyperthermia Neg Hx    Pseudochol deficiency Neg Hx     Social History   Tobacco Use   Smoking status: Former    Types: Cigarettes   Smokeless tobacco: Never   Tobacco comments:    socially  [name]   Vaping Use: Never used  Substance Use Topics   Alcohol use: Not Currently   Drug use: No    Allergies:  Allergies  Allergen Reactions   Gluten Meal Other (See Comments)    Joints swell up   Naproxen Other (See Comments)    "bad stomach ache"    Medications Prior to Admission   Medication Sig Dispense Refill Last Dose   acetaminophen (TYLENOL) 650 MG CR tablet Take 650 mg by mouth every 8 (eight) hours as needed for pain.   12/17/2020 at 0900   Prenatal Vit-Fe Fumarate-FA (PRENATAL VITAMIN PO) Take by mouth.   12/15/2020    Review of Systems  Gastrointestinal:  Positive for abdominal pain.  Genitourinary:  Positive for vaginal bleeding.  Physical Exam   Blood pressure 136/71, pulse 75, temperature 98.2 F (36.8 C), resp. rate 18, last menstrual period 10/15/2020.  Physical Exam Vitals and nursing note reviewed. Exam conducted with a chaperone present.  Constitutional:      General: She is not in acute distress.    Appearance: Normal appearance.  HENT:     Head: Normocephalic and atraumatic.  Cardiovascular:     Rate and Rhythm: Normal rate.  Pulmonary:     Effort: Pulmonary effort is  normal. No respiratory distress.  Abdominal:     General: There is no distension.     Palpations: Abdomen is soft. There is no mass.     Tenderness: There is no abdominal tenderness. There is no guarding or rebound.     Hernia: No hernia is present.  Genitourinary:    Comments: External: no lesions or erythema Vagina: rugated, pink, moist, small amt bloody discharge Uterus: non enlarged, anteverted, non tender, no CMT Adnexae: no masses, no tenderness left, no tenderness right Cervix closed   Musculoskeletal:        General: Normal range of motion.  Skin:    General: Skin is warm and dry.  Neurological:     General: No focal deficit present.     Mental Status: She is alert and oriented to person, place, and time.  Psychiatric:        Mood and Affect: Mood normal.        Behavior: Behavior normal.   Results for orders placed or performed during the hospital encounter of 12/17/20 (from the past 24 hour(s))  CBC     Status: Abnormal   Collection Time: 12/17/20  3:21 PM  Result Value Ref Range   WBC 8.0 4.0 - 10.5 K/uL   RBC 4.24 3.87 - 5.11 MIL/uL    Hemoglobin 11.7 (L) 12.0 - 15.0 g/dL   HCT 09.4 70.9 - 62.8 %   MCV 86.8 80.0 - 100.0 fL   MCH 27.6 26.0 - 34.0 pg   MCHC 31.8 30.0 - 36.0 g/dL   RDW 36.6 29.4 - 76.5 %   Platelets 252 150 - 400 K/uL   nRBC 0.0 0.0 - 0.2 %  hCG, quantitative, pregnancy     Status: Abnormal   Collection Time: 12/17/20  3:21 PM  Result Value Ref Range   hCG, Beta Chain, Quant, S 2,255 (H) <5 mIU/mL  Wet prep, genital     Status: Abnormal   Collection Time: 12/17/20  3:55 PM   Specimen: Vaginal  Result Value Ref Range   Yeast Wet Prep HPF POC NONE SEEN NONE SEEN   Trich, Wet Prep NONE SEEN NONE SEEN   Clue Cells Wet Prep HPF POC NONE SEEN NONE SEEN   WBC, Wet Prep HPF POC MODERATE (A) NONE SEEN   Sperm NONE SEEN    US OB LESS THAN 14 WEEKS WITH OB TRANSVAGINAL  Result Date: 12/17/2020 CLINICAL DATA:  Vaginal bleeding. EXAM: OBSTETRIC <14 WK [place] AND TRANSVAGINAL OB US TECHNIQUE: Both transabdominal and transvaginal ultrasound examinations were performed for complete evaluation of the gestation as well as the maternal uterus, adnexal regions, and pelvic cul-de-sac. Transvaginal technique was performed to assess early pregnancy. COMPARISON:  Ob ultrasound 11/17/2020. FINDINGS: Intrauterine gestational sac: None Heterogeneous material is seen at the level of the internal cervical os measuring 2.4 x 1.8 x 1.8 cm. Maternal uterus/adnexae: The ovaries are not well visualized. Right ovary is grossly within normal limits. There is a small amount of free fluid in the pelvis. IMPRESSION: 1. No intrauterine gestation identified. Heterogeneous material measuring 2.4 x 1.8 x 1.8 cm at the level of the internal os. In the setting of a positive pregnancy test, findings may related to abortion in progress. Short-term follow-up ultrasound and correlation with serial beta HCG recommended. Occult ectopic pregnancy and early normal IUP are not excluded, but less likely. 2. Ovaries not well visualized. 3. Trace free fluid in the  pelvis. Electronically Signed   By: Mcneil Sober.D.  On: 12/17/2020 18:25    MAU Course  Procedures  MDM Chart Labs ordered and reviewed. Suspect SAB in process. Consult with Dr. Adrian Blackwater, plan for rpt qhcg after 48 hrs. Stable for discharge home.   Assessment and Plan   1. Previous cesarean delivery affecting pregnancy, antepartum   2. Vaginal bleeding in pregnancy   3. Pregnancy, location unknown   4. Blood type, Rh positive    Discharge home Follow up at [facility] on 12/20/20  SAB/ectopic precautions Pelvic rest Tylenol prn  Allergies as of 12/17/2020       Reactions   Gluten Meal Other (See Comments)   Joints swell up   Naproxen Other (See Comments)   "bad stomach ache"        Medication List     TAKE these medications    acetaminophen 650 MG CR tablet Commonly known as: TYLENOL Take 650 mg by mouth every 8 (eight) hours as needed for pain.   PRENATAL VITAMIN PO Take by mouth.       Donette Larry, CNM 12/17/2020, 7:12 PM

## 2020-12-18 LAB — GC/CHLAMYDIA PROBE AMP (~~LOC~~) NOT AT ARMC
Chlamydia: NEGATIVE
Comment: NEGATIVE
Comment: NORMAL
Neisseria Gonorrhea: NEGATIVE

## 2020-12-20 ENCOUNTER — Ambulatory Visit: Payer: Self-pay

## 2021-01-03 ENCOUNTER — Encounter: Payer: Self-pay | Admitting: Family Medicine

## 2021-01-07 ENCOUNTER — Other Ambulatory Visit: Payer: Self-pay

## 2021-01-07 ENCOUNTER — Ambulatory Visit (INDEPENDENT_AMBULATORY_CARE_PROVIDER_SITE_OTHER): Payer: Self-pay | Admitting: Medical

## 2021-01-07 VITALS — BP 127/60 | HR 82 | Resp 18 | Ht 61.0 in | Wt 235.0 lb

## 2021-01-07 DIAGNOSIS — R739 Hyperglycemia, unspecified: Secondary | ICD-10-CM

## 2021-01-07 DIAGNOSIS — E7849 Other hyperlipidemia: Secondary | ICD-10-CM

## 2021-01-07 DIAGNOSIS — Z6841 Body Mass Index (BMI) 40.0 and over, adult: Secondary | ICD-10-CM

## 2021-01-07 DIAGNOSIS — O09521 Supervision of elderly multigravida, first trimester: Secondary | ICD-10-CM

## 2021-01-07 DIAGNOSIS — F329 Major depressive disorder, single episode, unspecified: Secondary | ICD-10-CM

## 2021-01-07 DIAGNOSIS — N926 Irregular menstruation, unspecified: Secondary | ICD-10-CM

## 2021-01-07 DIAGNOSIS — F419 Anxiety disorder, unspecified: Secondary | ICD-10-CM

## 2021-01-07 DIAGNOSIS — O039 Complete or unspecified spontaneous abortion without complication: Secondary | ICD-10-CM

## 2021-01-07 LAB — HCG, QUANTITATIVE, PREGNANCY: Quantitative HCG: 3.11 m[IU]/mL

## 2021-01-07 MED ORDER — CLONAZEPAM 0.5 MG PO TABS: ORAL_TABLET | ORAL | 0 refills | Status: DC

## 2021-01-07 MED ORDER — VENLAFAXINE HCL ER 37.5 MG PO CP24: 37.5000 mg | ORAL_CAPSULE | Freq: Every day | ORAL | 0 refills | Status: DC

## 2021-01-07 NOTE — Patient Instructions (Addendum)
Advance age and recent spontaneous abortion. Sorry you went thru this.  Will get hcg quantatitative blood work as recommended when DC'd from hospital. Defering [place] as 16 days past and you cite cost concerns.  For depression and ancxiety rx low dose effexor 37.5 mg daily. If thoughts of harm to self or others then recommend ED evaluation at [facility] long.  Will prescribe clonazepam in event she get panic attacks rx advisement given.  For obesity will write you letter for work stating you would benefit from gym membership.  Hx of high cholesterol and elevated sugar. Will put future lipid panel and a1c.    Follow up in 2 weeks or sooner if needed.

## 2021-01-07 NOTE — Addendum Note (Signed)
Addended by: Gwenevere Abbot on: 01/07/2021 11:44 AM   Modules accepted: Orders

## 2021-01-07 NOTE — Progress Notes (Signed)
Subjective:    Patient ID: Rhonda Jackson, female    DOB: 1975-09-11, 45 y.o.   MRN: [id number]  HPI  Pt seen earlier in  September and October by ED and then Dr. Carmelia Roller. Cystitis and early pregnancy.    Then late October 24  went to hospital for  vaginal bleeding.   "45 y.o. [id number] @[redacted]w[redacted]d  presenting with VB and cramping. Spotting started 3 days ago and became heavier today with clots. Endorses cramping in lower abdomen. Rates 9/10. She took Tylenol which helped some. Reports IC prior to bleeding started"  Exam at hospital -Genitourinary:    Comments: External: no lesions or erythema Vagina: rugated, pink, moist, small amt bloody discharge Uterus: non enlarged, anteverted, non tender, no CMT Adnexae: no masses, no tenderness left, no tenderness right Cervix closed  .  IMPRESSION: 1. No intrauterine gestation identified. Heterogeneous material measuring 2.4 x 1.8 x 1.8 cm at the level of the internal os. In the setting of a positive pregnancy test, findings may related to abortion in progress. Short-term follow-up ultrasound and correlation with serial beta HCG recommended. Occult ectopic pregnancy and early normal IUP are not excluded, but less likely. 2. Ovaries not well visualized. 3. Trace free fluid in the pelvis. Electronically Signed   By: [place] M.D.   On: 12/17/2020 18:25    A/P  Assessment and Plan    1. Previous cesarean delivery affecting pregnancy, antepartum   2. Vaginal bleeding in pregnancy   3. Pregnancy, location unknown   4. Blood type, Rh positive     Pt states she has not seen gyn since dc from hospital. Pt states 4 days after  she went to hospital she saw fetal tissue. Gyn warned her that this was going to happen.no further abdomen cramps and no dc.     Pt is emotional about loss of spontaneous abortion.  Pt states she got married after she found out pregnant. Pt states mom has dementia. Pt was extremely stress at time of she was pregnant.     16 days since she saw past tissue. Pt declines any repeat 12/19/2020 citing no insurance. Willing to repeat hcg.   Review of Systems  Constitutional:  Negative for chills, fatigue and fever.  Respiratory:  Negative for cough, choking and chest tightness.   Cardiovascular:  Negative for chest pain and palpitations.  Gastrointestinal:  Negative for abdominal distention, abdominal pain, constipation and nausea.  Genitourinary:  Negative for dysuria, flank pain and frequency.  Musculoskeletal:  Negative for back pain.  Skin:  Negative for rash.  Neurological:  Negative for dizziness, seizures, weakness, numbness and headaches.  Hematological:  Negative for adenopathy. Does not bruise/bleed easily.  Psychiatric/Behavioral:  Positive for dysphoric mood. Negative for behavioral problems, decreased concentration, sleep disturbance and suicidal ideas. The patient is nervous/anxious.    Past Medical History:  Diagnosis Date   Abnormal Pap smear    Abortion    AMA (advanced maternal age) multigravida 35+    History of chlamydia    Obese    Tuberculosis    hx +PPD   Vaginal Pap smear, abnormal    colpo     Social History   Socioeconomic History   Marital status: Single    Spouse name: Not on file   Number of children: Not on file   Years of education: Not on file   Highest education level: Associate degree: academic program  Occupational History   Not on file  Tobacco Use  Smoking status: Former    Types: Cigarettes   Smokeless tobacco: Never   Tobacco comments:    socially  Vaping Use   Vaping Use: Never used  Substance and Sexual Activity   Alcohol use: Not Currently   Drug use: No   Sexual activity: Not Currently  Other Topics Concern   Not on file  Social History Narrative   Not on file   Social Determinants of Health   Financial Resource Strain: Not on file  Food Insecurity: Not on file  Transportation Needs: Not on file  Physical Activity: Not on file  Stress: Not  on file  Social Connections: Not on file  Intimate Partner Violence: Not on file    Past Surgical History:  Procedure Laterality Date   CESAREAN SECTION  01/28/2011   Procedure: CESAREAN SECTION;  Surgeon: [name], MD;  Location: [facility];  Service: Gynecology;  Laterality: N/A;   COLPOSCOPY     DILATION AND CURETTAGE OF UTERUS     tab x2   WISDOM TOOTH EXTRACTION      Family History  Problem Relation Age of Onset   Gout Father    Sleep apnea Father    Emphysema Father    Cancer Mother        breast cancer   Osteoporosis Mother    Anesthesia problems Neg Hx    Hypotension Neg Hx    Malignant hyperthermia Neg Hx    Pseudochol deficiency Neg Hx     Allergies  Allergen Reactions   Gluten Meal Other (See Comments)    Joints swell up   Naproxen Other (See Comments)    "bad stomach ache"    Current Outpatient Medications on File Prior to Visit  Medication Sig Dispense Refill   acetaminophen (TYLENOL) 650 MG CR tablet Take 650 mg by mouth every 8 (eight) hours as needed for pain.     Prenatal Vit-Fe Fumarate-FA (PRENATAL VITAMIN PO) Take by mouth.     No current facility-administered medications on file prior to visit.    BP 127/60   Pulse 82   Resp 18   Ht 5\' 1"  (1.549 m)   Wt 235 lb (106.6 kg)   LMP 10/15/2020 Comment: neg upreg today 11/17/2020  SpO2 100%   BMI 44.40 kg/m       Objective:   Physical Exam  General- No acute distress. Pleasant patient. Neck- Full range of motion, no jvd Lungs- Clear, even and unlabored. Heart- regular rate and rhythm. Neurologic- CNII- XII grossly intact.  Abdomen- soft, nt, nd, +bs, no rebound, or guarding. No organogmegally.  Back- no cva tenderness.        Assessment & Plan:   Patient Instructions  Advance age and recent spontaneous abortion. Sorry you went thru this.  Will get hcg quantatitative blood work as recommended when DC'd from hospital. Defering 11/19/2020 as 16 days past and you cite cost  concerns.  For depression and ancxiety rx low dose effexor 37.5 mg daily. If thoughts of harm to self or others then recommend ED evaluation at [facility] long.  For obesity will write you letter for work stating you would benefit from gym membership.  Follow up in 2 weeks or sooner if needed.    [facility], PA-C    Time spent with patient today was  40 minutes which consisted of char revdiew, precharting,  discussing diagnosis, work up treatment and documentation. Counseled pt on depressoin and she had question on possible panic attacks.

## 2021-01-20 NOTE — Progress Notes (Incomplete)
° °  Subjective:    Patient ID: Rhonda Jackson, female    DOB: 1975/12/13, 45 y.o.   MRN: [id number]  HPI  Pt on last visit had depression and anxiety. Plan below in "  "For depression and ancxiety rx low dose effexor 37.5 mg daily. If thoughts of harm to self or others then recommend ED evaluation at [facility] long."  Depression related to extreme stress from loss of pregnancy and mom dementia.  Also obese pt and I had written not for patient to join gym.  Review of Systems  Constitutional:  Negative for chills, fatigue and fever.  Respiratory:  Negative for cough, chest tightness, shortness of breath and wheezing.   Cardiovascular:  Negative for chest pain and palpitations.  Gastrointestinal:  Negative for abdominal pain, blood in stool, constipation, diarrhea and nausea.  Genitourinary:  Negative for dysuria, enuresis, flank pain and frequency.  Musculoskeletal:  Negative for back pain, joint swelling and myalgias.  Skin:  Negative for rash.  Neurological:  Negative for dizziness, syncope, speech difficulty, weakness, numbness and headaches.  Hematological:  Negative for adenopathy. Does not bruise/bleed easily.  Psychiatric/Behavioral:  Negative for behavioral problems, confusion, dysphoric mood, sleep disturbance and suicidal ideas. The patient is not nervous/anxious.         Objective:   Physical Exam General- No acute distress. Pleasant patient. Neck- Full range of motion, no jvd Lungs- Clear, even and unlabored. Heart- regular rate and rhythm. Neurologic- CNII- XII grossly intact.        Assessment & Plan:

## 2021-01-21 ENCOUNTER — Encounter: Payer: Self-pay | Admitting: Medical

## 2021-01-21 ENCOUNTER — Telehealth: Payer: Self-pay | Admitting: Medical

## 2021-01-21 NOTE — Telephone Encounter (Signed)
Please charge this pt a no show fee. I took time to prechart in prepartation for her visit and then no showed.   Thanks,

## 2021-01-21 NOTE — Progress Notes (Signed)
   Subjective:    Patient ID: Rhonda Jackson, female    DOB: 1976-01-18, 45 y.o.   MRN: [id number]  HPI    Review of Systems     Objective:   Physical Exam        Assessment & Plan:   This encounter was created in error - please disregard. This encounter was created in error - please disregard.

## 2021-10-31 ENCOUNTER — Encounter (HOSPITAL_BASED_OUTPATIENT_CLINIC_OR_DEPARTMENT_OTHER): Payer: Self-pay | Admitting: Emergency Medicine

## 2021-10-31 ENCOUNTER — Other Ambulatory Visit: Payer: Self-pay

## 2021-10-31 ENCOUNTER — Emergency Department (HOSPITAL_BASED_OUTPATIENT_CLINIC_OR_DEPARTMENT_OTHER)
Admission: EM | Admit: 2021-10-31 | Discharge: 2021-10-31 | Disposition: A | Discharge status: Home / Self Care | Payer: Self-pay | Attending: Emergency Medicine | Admitting: Emergency Medicine

## 2021-10-31 ENCOUNTER — Emergency Department (HOSPITAL_BASED_OUTPATIENT_CLINIC_OR_DEPARTMENT_OTHER): Payer: Self-pay

## 2021-10-31 DIAGNOSIS — M25512 Pain in left shoulder: Secondary | ICD-10-CM | POA: Insufficient documentation

## 2021-10-31 DIAGNOSIS — M7989 Other specified soft tissue disorders: Secondary | ICD-10-CM

## 2021-10-31 DIAGNOSIS — R0602 Shortness of breath: Secondary | ICD-10-CM | POA: Insufficient documentation

## 2021-10-31 DIAGNOSIS — R519 Headache, unspecified: Secondary | ICD-10-CM | POA: Insufficient documentation

## 2021-10-31 DIAGNOSIS — M25511 Pain in right shoulder: Secondary | ICD-10-CM | POA: Insufficient documentation

## 2021-10-31 DIAGNOSIS — R2243 Localized swelling, mass and lump, lower limb, bilateral: Secondary | ICD-10-CM | POA: Insufficient documentation

## 2021-10-31 LAB — BRAIN NATRIURETIC PEPTIDE: B Natriuretic Peptide: 36.3 pg/mL (ref 0.0–100.0)

## 2021-10-31 LAB — CBC
HCT: 35 % — ABNORMAL LOW (ref 36.0–46.0)
Hemoglobin: 11.4 g/dL — ABNORMAL LOW (ref 12.0–15.0)
MCH: 27.5 pg (ref 26.0–34.0)
MCHC: 32.6 g/dL (ref 30.0–36.0)
MCV: 84.3 fL (ref 80.0–100.0)
Platelets: 241 10*3/uL (ref 150–400)
RBC: 4.15 MIL/uL (ref 3.87–5.11)
RDW: 14.3 % (ref 11.5–15.5)
WBC: 7.8 10*3/uL (ref 4.0–10.5)
nRBC: 0 % (ref 0.0–0.2)

## 2021-10-31 LAB — BASIC METABOLIC PANEL
Anion gap: 7 (ref 5–15)
BUN: 14 mg/dL (ref 6–20)
CO2: 25 mmol/L (ref 22–32)
Calcium: 8.7 mg/dL — ABNORMAL LOW (ref 8.9–10.3)
Chloride: 105 mmol/L (ref 98–111)
Creatinine, Ser: 0.79 mg/dL (ref 0.44–1.00)
GFR, Estimated: >60 mL/min (ref >60)
Glucose, Bld: 96 mg/dL (ref 70–99)
Potassium: 3.5 mmol/L (ref 3.5–5.1)
Sodium: 137 mmol/L (ref 135–145)

## 2021-10-31 LAB — TROPONIN I (HIGH SENSITIVITY)
Troponin I (High Sensitivity): 2 ng/L (ref <18)
Troponin I (High Sensitivity): 3 ng/L (ref <18)

## 2021-10-31 LAB — PREGNANCY, URINE: Preg Test, Ur: NEGATIVE

## 2021-10-31 NOTE — ED Notes (Signed)
Pt is aware she needs urine sample. She is asking for water. RN informed.

## 2021-10-31 NOTE — ED Provider Notes (Signed)
[facility] EMERGENCY DEPARTMENT Provider Note   CSN: [id number] Arrival date & time: 10/31/21  1738    History  Chief Complaint  Patient presents with   Shortness of Breath    Rhonda Jackson is a 46 y.o. female past medical history here for evaluation multiple complaints.  Noted today she had some swelling to her bilateral feet.  She also had pain to her bilateral shoulders.  No chest pain, back pain, shortness of breath.  Mild aching headache as well.  No recent head trauma.  No numbness or weakness.  No diplopia.  No difficulty with word finding.  No PND, orthopnea.  No history of PE or DVT.  No calf swelling or pain.  Has noted that she is been following all liquid diet over the last 4 days  HPI     Home Medications Prior to Admission medications   Medication Sig Start Date End Date Taking? Authorizing Provider  acetaminophen (TYLENOL) 650 MG CR tablet Take 650 mg by mouth every 8 (eight) hours as needed for pain.    [provider]  clonazePAM (KLONOPIN) 0.5 MG tablet 1/2-1 tab po twice daily prn severe anxiety/panic attack. 01/07/21   Saguier, Ramon Dredge, PA-C  Prenatal Vit-Fe Fumarate-FA (PRENATAL VITAMIN PO) Take by mouth.    [provider]  venlafaxine XR (EFFEXOR XR) 37.5 MG 24 hr capsule Take 1 capsule (37.5 mg total) by mouth daily with breakfast. 01/07/21   Saguier, Ramon Dredge, PA-C      Allergies    Gluten meal and Naproxen    Review of Systems   Review of Systems  Constitutional: Negative.   HENT: Negative.    Respiratory: Negative.    Cardiovascular:  Positive for leg swelling.  Gastrointestinal: Negative.   Genitourinary: Negative.   Musculoskeletal: Negative.        BIL shoulder pain  Skin: Negative.   Neurological: Negative.   All other systems reviewed and are negative.   Physical Exam Updated Vital Signs BP (!) 150/96   Pulse 72   Temp 98.2 F (36.8 C) (Oral)   Resp (!) 21   LMP 10/23/2021   SpO2 98%  Physical  Exam Vitals and nursing note reviewed.  Constitutional:      General: She is not in acute distress.    Appearance: She is well-developed. She is not ill-appearing, toxic-appearing or diaphoretic.  HENT:     Head: Normocephalic and atraumatic.  Eyes:     Pupils: Pupils are equal, round, and reactive to light.  Cardiovascular:     Rate and Rhythm: Normal rate.     Pulses: Normal pulses.          Radial pulses are 2+ on the right side and 2+ on the left side.       Dorsalis pedis pulses are 2+ on the right side and 2+ on the left side.     Heart sounds: Normal heart sounds.  Pulmonary:     Effort: Pulmonary effort is normal. No respiratory distress.     Breath sounds: Normal breath sounds.     Comments: Clear bilaterally, speaks in full sentences without difficulty Chest:     Comments: Non tender Abdominal:     General: Bowel sounds are normal. There is no distension.     Palpations: Abdomen is soft.  Musculoskeletal:        General: Normal range of motion.     Cervical back: Normal range of motion.     Right lower leg: No  tenderness. No edema.     Left lower leg: No tenderness. No edema.     Comments: No bony tenderness, compartments soft.  No appreciable lower extremity edema  Skin:    General: Skin is warm and dry.     Comments: No edema, erythema or warmth  Neurological:     General: No focal deficit present.     Mental Status: She is alert and oriented to person, place, and time.  Psychiatric:        Mood and Affect: Mood normal.    ED Results / Procedures / Treatments   Labs (all labs ordered are listed, but only abnormal results are displayed) Labs Reviewed  BASIC METABOLIC PANEL - Abnormal; Notable for the following components:      Result Value   Calcium 8.7 (*)    All other components within normal limits  CBC - Abnormal; Notable for the following components:   Hemoglobin 11.4 (*)    HCT 35.0 (*)    All other components within normal limits  PREGNANCY, URINE   BRAIN NATRIURETIC PEPTIDE  TROPONIN I (HIGH SENSITIVITY)  TROPONIN I (HIGH SENSITIVITY)    EKG EKG Interpretation  Date/Time:  Thursday October 31 2021 17:49:27 EDT Ventricular Rate:  70 PR Interval:  170 QRS Duration: 82 QT Interval:  388 QTC Calculation: 419 R Axis:   70 Text Interpretation: Normal sinus rhythm Normal ECG No significant change since last tracing Confirmed by Melene Plan [phone]) on 10/31/2021 5:57:19 PM  Radiology DG Chest 2 View  Result Date: 10/31/2021 CLINICAL DATA:  Shortness of breath. Bilateral LOWER extremity swelling. EXAM: CHEST - 2 VIEW COMPARISON:  None Available. FINDINGS: The heart size and mediastinal contours are within normal limits. Both lungs are clear. The visualized skeletal structures are unremarkable. IMPRESSION: No active cardiopulmonary disease. Electronically Signed   By: Norva Pavlov M.D.   On: 10/31/2021 18:35    Procedures Procedures    Medications Ordered in ED Medications - No data to display  ED Course/ Medical Decision Making/ A&P    46 year old here for evaluation multiple complaints.  Earlier today noted her bilateral feet were swollen.  Nontender.  Does not extend into the calf.  No PND or orthopnea.  No chest pain or shortness of breath.  Does not appear grossly fluid overloaded.  Also admits to bilateral shoulder pain with movement.  She has no obvious joint effusion.  No bony tenderness.  Her compartments are soft.  She is neurovascular intact.  She is no appreciable lower extremity swelling.  I low suspicion for DVT, infectious process  Labs and imaging personally viewed and interpreted:  CBC without leukocytosis, metabolic panel without significant abnormality Delta troponin flat BNP 36 Pregnancy test negative Chest x-ray without edema, infiltrates, cardiomegaly EKG without ischemic changes  Patient reassessed.  She appears otherwise well.  I have low suspicion for acute emergent process.  Discussed symptomatic  management at home and close help with her PCP which she is agreeable for.  The patient has been appropriately medically screened and/or stabilized in the ED. I have low suspicion for any other emergent medical condition which would require further screening, evaluation or treatment in the ED or require inpatient management.  Patient is hemodynamically stable and in no acute distress.  Patient able to ambulate in department prior to ED.  Evaluation does not show acute pathology that would require ongoing or additional emergent interventions while in the emergency department or further inpatient treatment.  I have discussed the  diagnosis with the patient and answered all questions.  Pain is been managed while in the emergency department and patient has no further complaints prior to discharge.  Patient is comfortable with plan discussed in room and is stable for discharge at this time.  I have discussed strict return precautions for returning to the emergency department.  Patient was encouraged to follow-up with PCP/specialist refer to at discharge.                           Medical Decision Making Amount and/or Complexity of Data Reviewed External Data Reviewed: labs, radiology, ECG and notes. Labs: ordered. Decision-making details documented in ED Course. Radiology: ordered and independent interpretation performed. Decision-making details documented in ED Course. ECG/medicine tests: ordered and independent interpretation performed. Decision-making details documented in ED Course.  Risk OTC drugs. Decision regarding hospitalization. Diagnosis or treatment significantly limited by social determinants of health.          Final Clinical Impression(s) / ED Diagnoses Final diagnoses:  Bilateral swelling of feet  Acute pain of both shoulders    Rx / DC Orders ED Discharge Orders     None         Fransisco Messmer A, PA-C 10/31/21 2234    Melene Plan, DO 10/31/21 2236

## 2021-10-31 NOTE — ED Triage Notes (Signed)
Pt arrives pov, to triage in wheelchair, c/o shob and bilateral LE swelling today. Also reports HA, LT shoulder pain and RT arm pain, and posterior neck pain. Also reports blurry vision intermittently. PT speech clear, AO x4, speaking in complete sentences

## 2021-10-31 NOTE — Discharge Instructions (Addendum)
Wear compression socks, limit your salt intake, elevate your legs.  You may take Tylenol as needed for pain.  Make sure to increase your protein intake at home  Return for new or worsening symptoms.

## 2021-11-04 ENCOUNTER — Other Ambulatory Visit: Payer: Self-pay | Admitting: Medical

## 2021-11-04 DIAGNOSIS — F419 Anxiety disorder, unspecified: Secondary | ICD-10-CM

## 2021-11-04 NOTE — Telephone Encounter (Signed)
Requesting: clonazepam 0.5mg   Contract: None UDS: None Last Visit: 01/07/21 Next Visit: None Last Refill: 01/07/21 #4 and 0RF  Please Advise

## 2022-02-07 ENCOUNTER — Ambulatory Visit: Payer: Self-pay | Admitting: Obstetrics and Gynecology

## 2022-03-14 ENCOUNTER — Ambulatory Visit: Payer: Self-pay | Admitting: Obstetrics and Gynecology

## 2022-08-04 ENCOUNTER — Ambulatory Visit (INDEPENDENT_AMBULATORY_CARE_PROVIDER_SITE_OTHER): Payer: Self-pay | Admitting: Medical

## 2022-08-04 VITALS — BP 134/60 | HR 70 | Temp 98.0°F | Resp 18 | Ht 61.0 in | Wt 228.4 lb

## 2022-08-04 DIAGNOSIS — G4700 Insomnia, unspecified: Secondary | ICD-10-CM

## 2022-08-04 DIAGNOSIS — R232 Flushing: Secondary | ICD-10-CM

## 2022-08-04 DIAGNOSIS — F3289 Other specified depressive episodes: Secondary | ICD-10-CM

## 2022-08-04 DIAGNOSIS — F419 Anxiety disorder, unspecified: Secondary | ICD-10-CM

## 2022-08-04 DIAGNOSIS — R5383 Other fatigue: Secondary | ICD-10-CM

## 2022-08-04 MED ORDER — BUSPIRONE HCL 7.5 MG PO TABS: 7.5000 mg | ORAL_TABLET | Freq: Two times a day (BID) | ORAL | 0 refills | Status: AC

## 2022-08-04 MED ORDER — VENLAFAXINE HCL ER 37.5 MG PO CP24: 37.5000 mg | ORAL_CAPSULE | Freq: Every day | ORAL | 0 refills | Status: DC

## 2022-08-04 NOTE — Patient Instructions (Addendum)
1. Fatigue, unspecified type - CBC w/Diff - TSH - B12 - Vitamin B1 - Comp Met (CMET)  2. Anxiety, depression and Insomnia. Effexor 37.5 mg daily.  After 2 weeks recommend starting buspar.  Recommend eat healthy, exercise and try to get 8 hours of sleep at night. Consider counseling and maybe psychiatrist.  On follow up may add trazadone  3. Hot flashes I don't current perimenopausal. Afer discussion did order fsh - [facility]   Follow up in one month or sooner if needed.

## 2022-08-04 NOTE — Progress Notes (Signed)
Subjective:    Patient ID: Rhonda Jackson, female    DOB: 06-Nov-1975, 47 y.o.   MRN: [id number]  HPI  Pt in reporting she is feeling easily angry and overwhelmed. Losing temper with husband anothers. Pt has reached out to her pastor and close friends.  She has lost appetite. She is feeling fatigues.  She is teary eyed as she explains the above.   Autistic son graduated. Pt state some challenges with mom who has dementia. 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  LMP- 2nd day of current cycle. She states no missed cycle.sometime lighter cyce. Someimes heavier.  Phq-9 score 14. Gad-7 14.     Review of Systems  Constitutional:  Positive for fatigue. Negative for chills and fever.  Respiratory:  Negative for chest tightness, shortness of breath, wheezing and stridor.   Cardiovascular:  Negative for chest pain and palpitations.  Gastrointestinal:  Negative for abdominal pain.  Endocrine:       Hot flashes.  Genitourinary:  Negative for dysuria.  Musculoskeletal:  Negative for back pain.  Neurological:  Negative for dizziness, seizures, light-headedness and headaches.  Hematological:  Negative for adenopathy. Does not bruise/bleed easily.  Psychiatric/Behavioral:  Positive for dysphoric mood and sleep disturbance. Negative for suicidal ideas. The patient is nervous/anxious.     Past Medical History:  Diagnosis Date   Abnormal Pap smear    Abortion    AMA (advanced maternal age) multigravida 35+    History of chlamydia    Obese    Tuberculosis    hx +PPD   Vaginal Pap smear, abnormal    colpo     Social History   Socioeconomic History   Marital status: Single    Spouse name: Not on file   Number of children: Not on file   Years of education: Not on file   Highest education level: Associate degree: academic program  Occupational History   Not on file  Tobacco Use   Smoking status: Former    Types: Cigarettes   Smokeless tobacco: Never   Tobacco  comments:    socially  Vaping Use   Vaping Use: Never used  Substance and Sexual Activity   Alcohol use: Not Currently   Drug use: No   Sexual activity: Not Currently  Other Topics Concern   Not on file  Social History Narrative   Not on file   Social Determinants of Health   Financial Resource Strain: Low Risk  (08/01/2022)   Overall Financial Resource Strain (CARDIA)    Difficulty of Paying Living Expenses: Not very hard  Food Insecurity: No Food Insecurity (08/01/2022)   Hunger Vital Sign    Worried About Running Out of Food in the Last Year: Never true    Ran Out of Food in the Last Year: Never true  Transportation Needs: No Transportation Needs (08/01/2022)   PRAPARE - Administrator, Civil Service (Medical): No    Lack of Transportation (Non-Medical): No  Physical Activity: Unknown (08/01/2022)   Exercise Vital Sign    Days of Exercise per Week: 0 days    Minutes of Exercise per Session: Not on file  Stress: Stress Concern Present (08/01/2022)   [employer] of Occupational Health - Occupational Stress Questionnaire    Feeling of Stress : To some extent  Social Connections: Unknown (08/01/2022)   Social Connection and Isolation Panel [NHANES]    Frequency of Communication with Friends and Family: More than three times a week  Frequency of Social Gatherings with Friends and Family: Once a week    Attends Religious Services: More than 4 times per year    Active Member of Clubs or Organizations: Patient declined    Attends Banker Meetings: Not on file    Marital Status: Married  Catering manager Violence: Not on file    Past Surgical History:  Procedure Laterality Date   CESAREAN SECTION  01/28/2011   Procedure: CESAREAN SECTION;  Surgeon: [name], MD;  Location: [facility];  Service: Gynecology;  Laterality: N/A;   COLPOSCOPY     DILATION AND CURETTAGE OF UTERUS     tab x2   WISDOM TOOTH EXTRACTION      Family History  Problem  Relation Age of Onset   Gout Father    Sleep apnea Father    Emphysema Father    Cancer Mother        breast cancer   Osteoporosis Mother    Anesthesia problems Neg Hx    Hypotension Neg Hx    Malignant hyperthermia Neg Hx    Pseudochol deficiency Neg Hx     Allergies  Allergen Reactions   Gluten Meal Other (See Comments)    Joints swell up   Naproxen Other (See Comments)    "bad stomach ache"    No current outpatient medications on file prior to visit.   No current facility-administered medications on file prior to visit.    BP 134/60   Pulse 70   Temp 98 F (36.7 C)   Resp 18   Ht 5\' 1"  (1.549 m)   Wt 228 lb 6.4 oz (103.6 kg)   LMP 08/03/2022   SpO2 98%   BMI 43.16 kg/m        Objective:   Physical Exam  General Mental Status- Alert. General Appearance- Not in acute distress.   Skin General: Color- Normal Color. Moisture- Normal Moisture.  Neck Carotid Arteries- Normal color. Moisture- Normal Moisture. No carotid bruits. No JVD.  Chest and Lung Exam Auscultation: Breath Sounds:-Normal.  Cardiovascular Auscultation:Rythm- Regular. Murmurs & Other Heart Sounds:Auscultation of the heart reveals- No Murmurs.  Abdomen Inspection:-Inspeection Normal. Palpation/Percussion:Note:No mass. Palpation and Percussion of the abdomen reveal- Non Tender, Non Distended + BS, no rebound or guarding.   Neurologic Cranial Nerve exam:- CN III-XII intact(No nystagmus), symmetric smile.  Strength:- 5/5 equal and symmetric strength both upper and lower extremities.       Assessment & Plan:   Patient Instructions  1. Fatigue, unspecified type - CBC w/Diff - TSH - B12 - Vitamin B1 - Comp Met (CMET)  2. Anxiety, depression and Insomnia. Effexor 37.5 mg daily.  After 2 weeks recommend starting buspar.   On follow up may add trazadone  3. Hot flashes I don't current perimenopausal. Afer discussion did order fsh - [facility]   Follow up in one month or sooner  if needed.   Esperanza Richters, PA-C

## 2022-08-05 ENCOUNTER — Telehealth: Payer: Self-pay

## 2022-08-05 DIAGNOSIS — R5383 Other fatigue: Secondary | ICD-10-CM

## 2022-08-05 LAB — COMPREHENSIVE METABOLIC PANEL
ALT: 14 U/L (ref 0–35)
AST: 14 U/L (ref 0–37)
Albumin: 4.1 g/dL (ref 3.5–5.2)
Alkaline Phosphatase: 56 U/L (ref 39–117)
BUN: 13 mg/dL (ref 6–23)
CO2: 27 mEq/L (ref 19–32)
Calcium: 9.3 mg/dL (ref 8.4–10.5)
Chloride: 104 mEq/L (ref 96–112)
Creatinine, Ser: 0.87 mg/dL (ref 0.40–1.20)
GFR: 79.87 mL/min (ref >60.00)
Glucose, Bld: 92 mg/dL (ref 70–99)
Potassium: 3.7 mEq/L (ref 3.5–5.1)
Sodium: 138 mEq/L (ref 135–145)
Total Bilirubin: 0.3 mg/dL (ref 0.2–1.2)
Total Protein: 6.6 g/dL (ref 6.0–8.3)

## 2022-08-05 LAB — CBC WITH DIFFERENTIAL/PLATELET
Basophils Absolute: 0.1 10*3/uL (ref 0.0–0.1)
Basophils Relative: 0.8 % (ref 0.0–3.0)
Eosinophils Absolute: 0.2 10*3/uL (ref 0.0–0.7)
Eosinophils Relative: 2 % (ref 0.0–5.0)
HCT: 64.1 % (ref 36.0–46.0)
Hemoglobin: 20.5 g/dL (ref 12.0–15.0)
Lymphocytes Relative: 28.4 % (ref 12.0–46.0)
Lymphs Abs: 3.5 10*3/uL (ref 0.7–4.0)
MCHC: 32 g/dL (ref 30.0–36.0)
MCV: 87.3 fl (ref 78.0–100.0)
Monocytes Absolute: 0.7 10*3/uL (ref 0.1–1.0)
Monocytes Relative: 5.7 % (ref 3.0–12.0)
Neutro Abs: 7.8 10*3/uL — ABNORMAL HIGH (ref 1.4–7.7)
Neutrophils Relative %: 63.1 % (ref 43.0–77.0)
Platelets: 377 10*3/uL (ref 150.0–400.0)
RBC: 7.34 Mil/uL — ABNORMAL HIGH (ref 3.87–5.11)
RDW: 15.3 % (ref 11.5–15.5)
WBC: 12.4 10*3/uL — ABNORMAL HIGH (ref 4.0–10.5)

## 2022-08-05 LAB — VITAMIN B12: Vitamin B-12: 175 pg/mL — ABNORMAL LOW (ref 211–911)

## 2022-08-05 LAB — FOLLICLE STIMULATING HORMONE: FSH: 11.1 m[IU]/mL

## 2022-08-05 LAB — T4, FREE: Free T4: 1.03 ng/dL (ref 0.60–1.60)

## 2022-08-05 LAB — TSH: TSH: 1.06 u[IU]/mL (ref 0.35–5.50)

## 2022-08-05 NOTE — Addendum Note (Signed)
Addended by: Thelma Barge D on: 08/05/2022 01:46 PM   Modules accepted: Orders

## 2022-08-05 NOTE — Telephone Encounter (Signed)
CRITICAL VALUE STICKER  CRITICAL VALUE: HGB: 20.5 HCT: 64.1   RECEIVER (on-site recipient of call): Melton Alar, [place]   DATE & TIME NOTIFIED:  08/05/22, 11:20AM  MESSENGER (representative from lab): Clydie Braun  MD NOTIFIED: PCP- Esperanza Richters, PA-C  TIME OF NOTIFICATION: 08/05/22, 11:20AM  RESPONSE:  Pending

## 2022-08-05 NOTE — Telephone Encounter (Signed)
For this patient yesterday, she had 2 tests for purple top.  I sent the spunned down one and this is maybe messed up the values.  I have called the patient back in for redraw for cbc.  I apologize about this she was a hard stick and thought it would maybe work.

## 2022-08-06 ENCOUNTER — Other Ambulatory Visit (INDEPENDENT_AMBULATORY_CARE_PROVIDER_SITE_OTHER): Payer: Self-pay

## 2022-08-06 DIAGNOSIS — R5383 Other fatigue: Secondary | ICD-10-CM

## 2022-08-06 LAB — CBC WITH DIFFERENTIAL/PLATELET
Basophils Absolute: 0 10*3/uL (ref 0.0–0.1)
Basophils Relative: 0.6 % (ref 0.0–3.0)
Eosinophils Absolute: 0.1 10*3/uL (ref 0.0–0.7)
Eosinophils Relative: 2.5 % (ref 0.0–5.0)
HCT: 36.5 % (ref 36.0–46.0)
Hemoglobin: 12 g/dL (ref 12.0–15.0)
Lymphocytes Relative: 37 % (ref 12.0–46.0)
Lymphs Abs: 2.2 10*3/uL (ref 0.7–4.0)
MCHC: 32.9 g/dL (ref 30.0–36.0)
MCV: 85 fl (ref 78.0–100.0)
Monocytes Absolute: 0.3 10*3/uL (ref 0.1–1.0)
Monocytes Relative: 5.4 % (ref 3.0–12.0)
Neutro Abs: 3.3 10*3/uL (ref 1.4–7.7)
Neutrophils Relative %: 54.5 % (ref 43.0–77.0)
Platelets: 216 10*3/uL (ref 150.0–400.0)
RBC: 4.3 Mil/uL (ref 3.87–5.11)
RDW: 14.5 % (ref 11.5–15.5)
WBC: 6 10*3/uL (ref 4.0–10.5)

## 2022-08-06 NOTE — Progress Notes (Signed)
Pt has came in to repeat and results are in epic.

## 2022-08-06 NOTE — Telephone Encounter (Signed)
Pt came in and had labs redrawn today.

## 2022-08-08 LAB — VITAMIN B1: Vitamin B1 (Thiamine): 8 nmol/L (ref 8–30)

## 2022-08-15 ENCOUNTER — Telehealth: Payer: Self-pay | Admitting: Medical

## 2022-08-15 NOTE — Telephone Encounter (Signed)
Pt states she started taking the efffexor on Monday and started getting a rash on her cheeks and arms. No trouble swallowing or breathing and she did check with the pharmacist which recommended she f/u with pcp to see if she should continue rx as the rash may get better. Also she wanted pcp to be aware she is okay with picking up b-12 at the pharmacy.

## 2022-08-18 NOTE — Telephone Encounter (Signed)
Spoke with Pt , stated she would rather continue taking effexor since its already in her system , she stated the rash is sort of cleared up. Told pt to give our office a call back on Friday in regards to the medication and rash, voiced understanding   Pt also agree'd to pick up OTC b12

## 2022-08-26 ENCOUNTER — Encounter: Payer: Self-pay | Admitting: Medical

## 2022-09-03 ENCOUNTER — Ambulatory Visit: Payer: Self-pay | Admitting: Medical

## 2022-09-08 ENCOUNTER — Other Ambulatory Visit: Payer: Self-pay | Admitting: Medical

## 2022-09-09 MED ORDER — VENLAFAXINE HCL ER 37.5 MG PO CP24: 37.5000 mg | ORAL_CAPSULE | Freq: Every day | ORAL | 0 refills | Status: DC

## 2022-10-20 ENCOUNTER — Other Ambulatory Visit: Payer: Self-pay | Admitting: Medical

## 2022-10-20 MED ORDER — VENLAFAXINE HCL ER 37.5 MG PO CP24: 37.5000 mg | ORAL_CAPSULE | Freq: Every day | ORAL | 0 refills | Status: DC

## 2022-12-03 ENCOUNTER — Other Ambulatory Visit: Payer: Self-pay | Admitting: Medical

## 2022-12-05 ENCOUNTER — Other Ambulatory Visit: Payer: Self-pay | Admitting: Medical

## 2022-12-08 MED ORDER — VENLAFAXINE HCL ER 37.5 MG PO CP24: 37.5000 mg | ORAL_CAPSULE | Freq: Every day | ORAL | 0 refills | Status: AC

## 2023-01-07 IMAGING — US US OB < 14 WEEKS - US OB TV
1 series · 15 of 28 positions shown · non-contrast
Comparison: Ob ultrasound 11/17/2020.

CLINICAL DATA: Vaginal bleeding.

EXAM:
OBSTETRIC <14 WK US AND TRANSVAGINAL OB US
TECHNIQUE: Both transabdominal and transvaginal ultrasound examinations were
performed for complete evaluation of the gestation as well as the
maternal uterus, adnexal regions, and pelvic cul-de-sac.
Transvaginal technique was performed to assess early pregnancy.

[Series 1: us ob < 14 weeks - us ob tv · 15 of 40 slices shown]
[im 1/40]
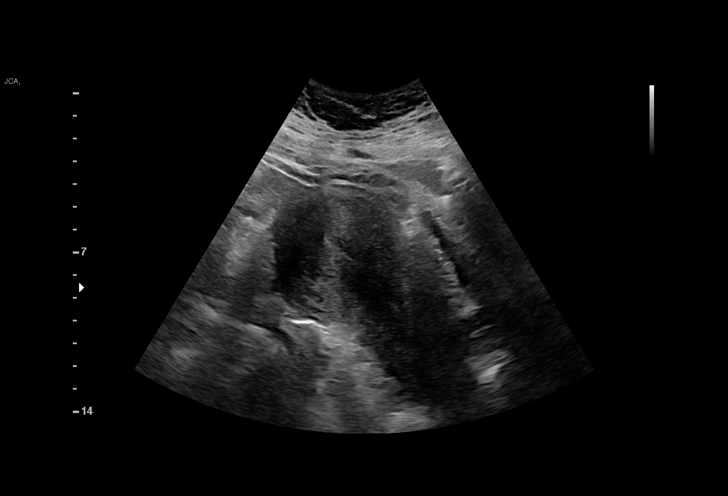
[im 3/40]
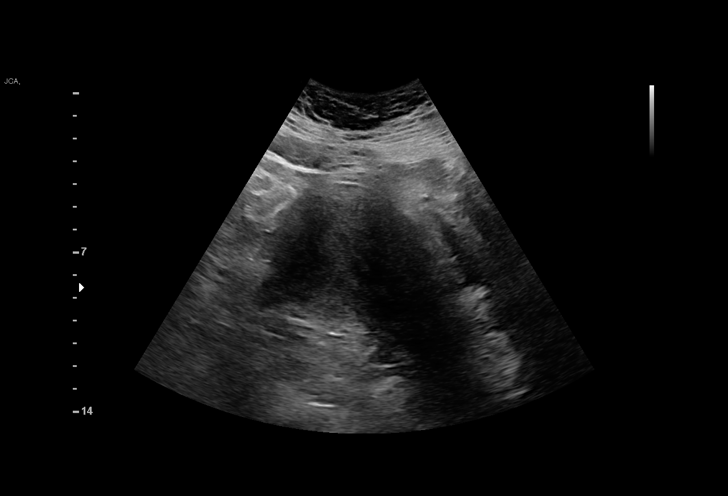
[im 6/40]
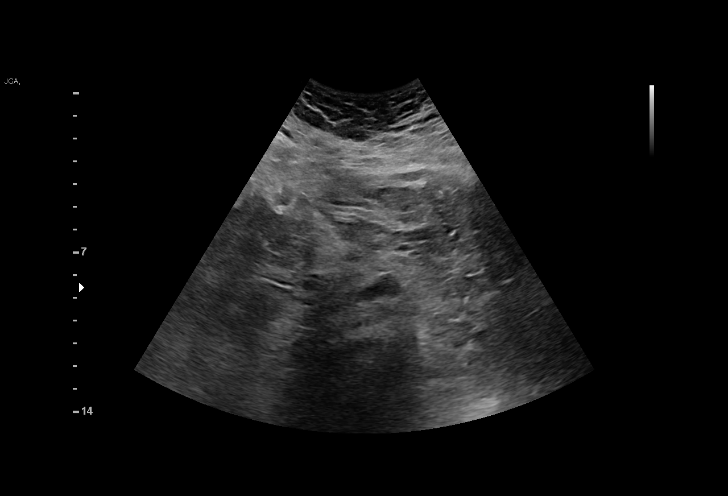
[im 9/40]
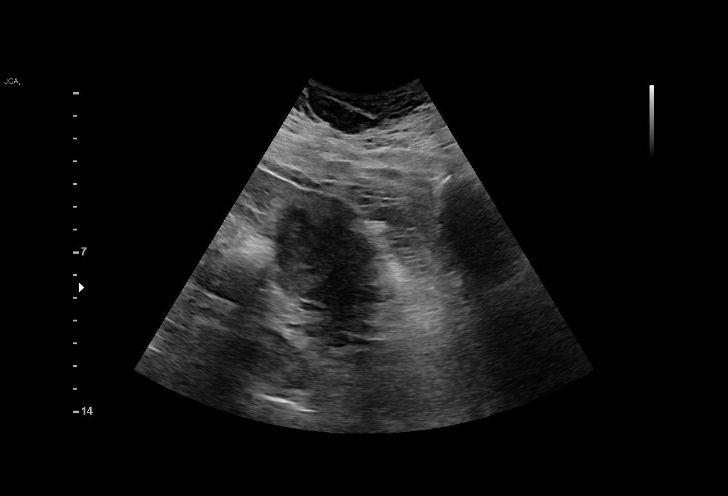
[im 12/40]
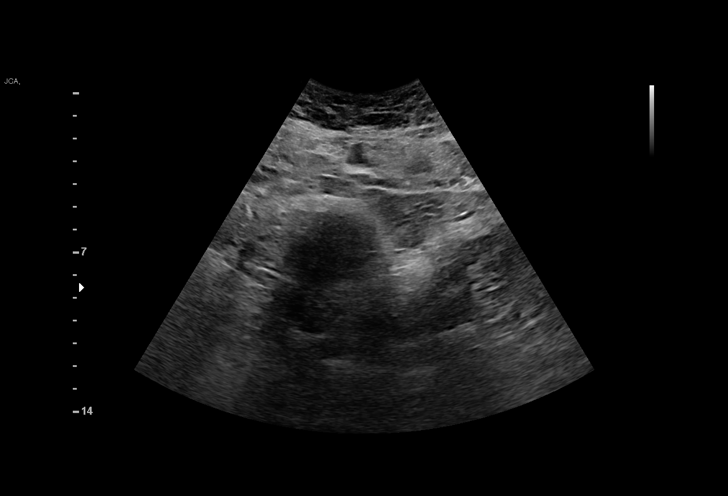
[im 15/40]
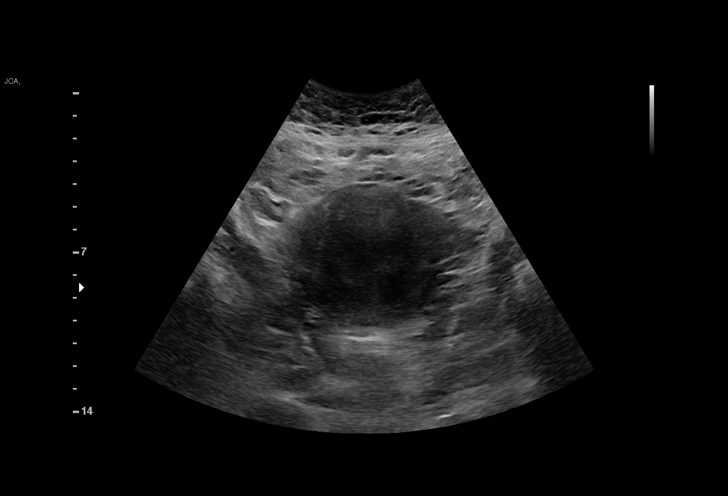
[im 18/40]
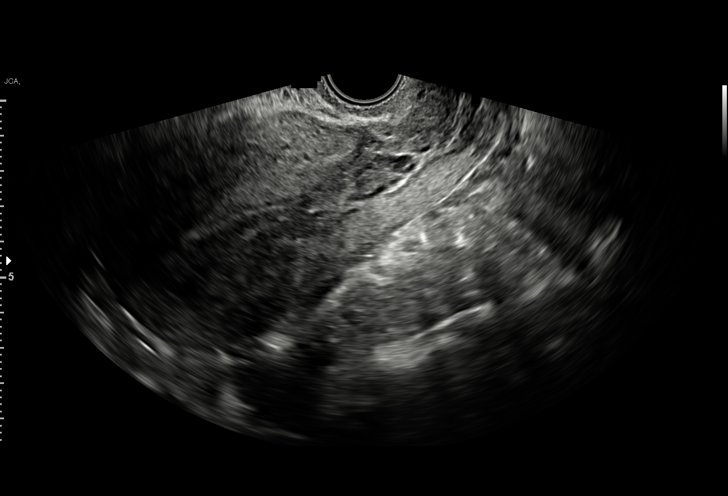
[im 21/40]
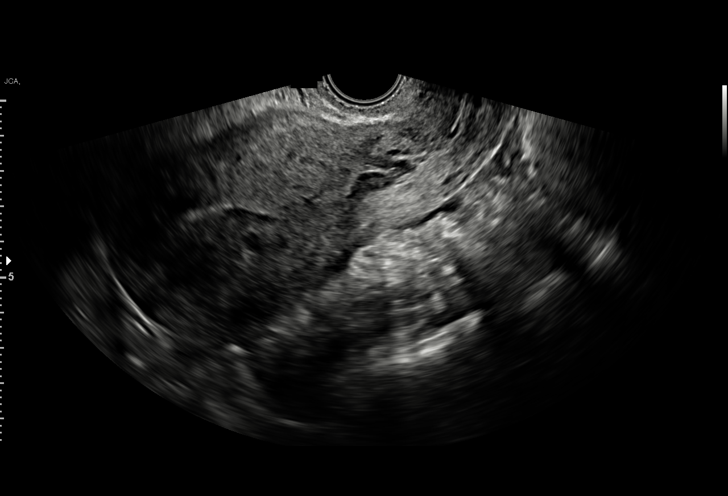
[im 22/40]
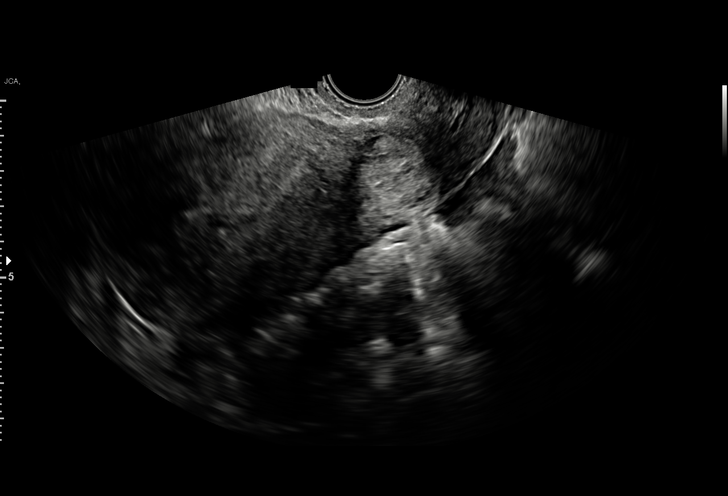
[im 25/40]
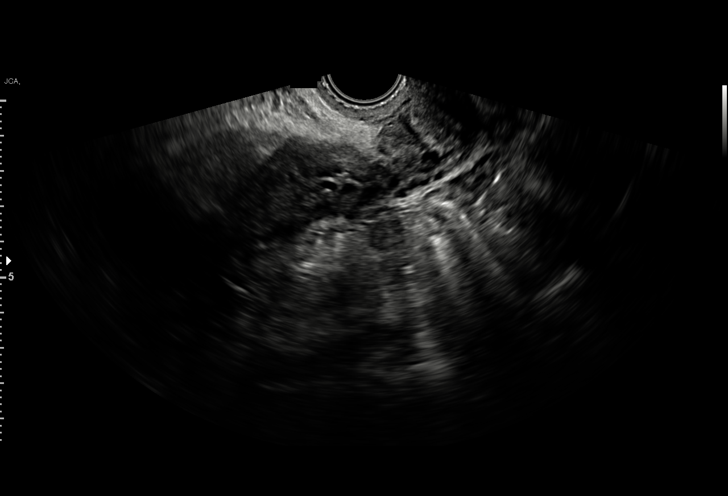
[im 28/40]
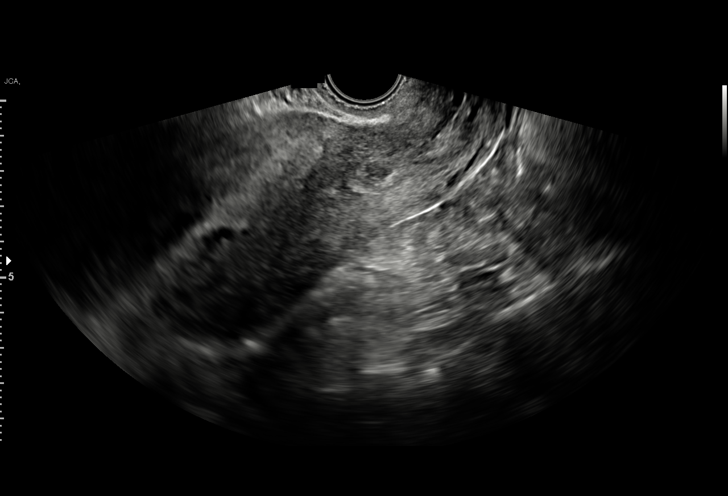
[im 31/40]
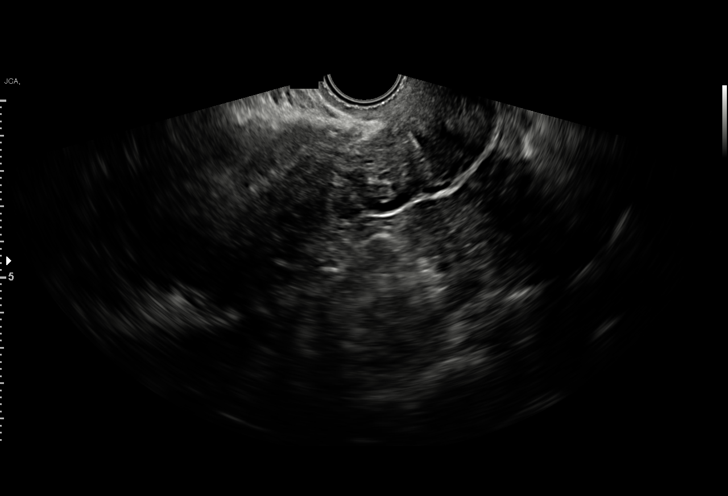
[im 34/40]
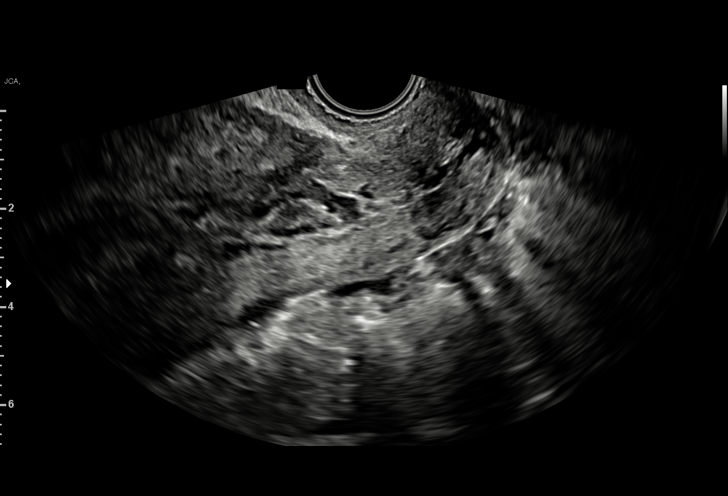
[im 37/40]
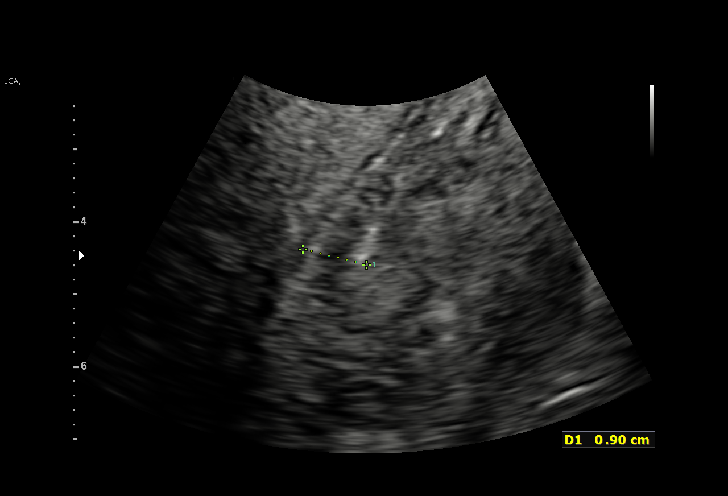
[im 40/40]
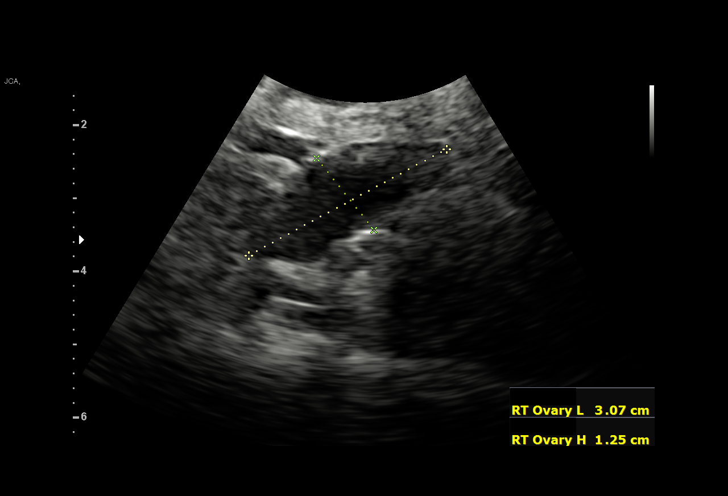

[15 of 28 positions shown; findings below may reference images not displayed]

FINDINGS: Intrauterine gestational sac: None

Heterogeneous material is seen at the level of the internal cervical
os measuring 2.4 x 1.8 x 1.8 cm.

Maternal uterus/adnexae: The ovaries are not well visualized. Right
ovary is grossly within normal limits. There is a small amount of
free fluid in the pelvis.
IMPRESSION: 1. No intrauterine gestation identified. Heterogeneous material
measuring 2.4 x 1.8 x 1.8 cm at the level of the internal os. In the
setting of a positive pregnancy test, findings may related to
abortion in progress. Short-term follow-up ultrasound and
correlation with serial beta HCG recommended. Occult ectopic
pregnancy and early normal IUP are not excluded, but less likely.
2. Ovaries not well visualized.
3. Trace free fluid in the pelvis.

## 2023-01-08 ENCOUNTER — Encounter: Payer: Self-pay | Admitting: Medical

## 2023-01-09 ENCOUNTER — Encounter: Payer: Self-pay | Admitting: Medical

## 2023-02-11 ENCOUNTER — Ambulatory Visit: Payer: Self-pay | Admitting: Medical

## 2023-05-26 ENCOUNTER — Telehealth: Payer: Self-pay

## 2023-05-26 ENCOUNTER — Ambulatory Visit: Payer: Self-pay | Admitting: Medical

## 2023-05-26 NOTE — Telephone Encounter (Deleted)
 No show letter

## 2023-05-26 NOTE — Telephone Encounter (Signed)
error 

## 2023-06-03 ENCOUNTER — Encounter: Payer: Self-pay | Admitting: Medical

## 2023-06-03 ENCOUNTER — Other Ambulatory Visit (HOSPITAL_COMMUNITY)
Admission: RE | Admit: 2023-06-03 | Discharge: 2023-06-03 | Disposition: A | Discharge status: Home / Self Care | Payer: Self-pay | Source: Ambulatory Visit | Attending: Medical | Admitting: Medical

## 2023-06-03 ENCOUNTER — Ambulatory Visit (INDEPENDENT_AMBULATORY_CARE_PROVIDER_SITE_OTHER): Payer: Self-pay | Admitting: Medical

## 2023-06-03 VITALS — BP 145/80 | HR 76 | Temp 98.7°F | Ht 61.5 in | Wt 230.4 lb

## 2023-06-03 DIAGNOSIS — Z13220 Encounter for screening for lipoid disorders: Secondary | ICD-10-CM

## 2023-06-03 DIAGNOSIS — Z113 Encounter for screening for infections with a predominantly sexual mode of transmission: Secondary | ICD-10-CM

## 2023-06-03 DIAGNOSIS — N921 Excessive and frequent menstruation with irregular cycle: Secondary | ICD-10-CM

## 2023-06-03 DIAGNOSIS — Z1231 Encounter for screening mammogram for malignant neoplasm of breast: Secondary | ICD-10-CM

## 2023-06-03 DIAGNOSIS — Z1211 Encounter for screening for malignant neoplasm of colon: Secondary | ICD-10-CM

## 2023-06-03 DIAGNOSIS — R5383 Other fatigue: Secondary | ICD-10-CM

## 2023-06-03 DIAGNOSIS — Z0000 Encounter for general adult medical examination without abnormal findings: Secondary | ICD-10-CM

## 2023-06-03 NOTE — Patient Instructions (Addendum)
 For you wellness exam today I have ordered cbc, cmp, and lipid panel.  Vaccine tdap deferred.  Recommend exercise and healthy diet.  We will let you know lab results as they come in.  Follow up date appointment will be determined after lab review.    Irregular Menstrual Cycles Irregular, painful cycles with heavy bleeding and clots. Differential includes fibroids or ovarian cysts. Not consistent with menopause. - Refer to GYN for evaluation. - Consider ultrasound for fibroids or ovarian cysts.  STD Screening Requested STD screening post-separation. Informed consent obtained for self-swab testing. - Order STD screening: HIV, RPR, cervical vaginal swab for trichomoniasis and bacterial vaginosis.  Mental Health Status Anxiety and hopelessness improved after environmental change and medication discontinuation. No longer experiencing symptoms.  Pt bp has been up and down in past. Mild high today but better in past. Want you to get otc bp cuff electronic and check bp once daily and update me on reading in one month.  General Health Maintenance Family history of colon and breast cancer. Due for colonoscopy and mammogram. Wellness labs planned. - Order colonoscopy for colon cancer screening. - Order mammogram for breast cancer screening. - Order CBC, metabolic panel, lipid panel, B1, B12, T4, TSH. - Lipid panel non-fasting, consider repeat when fasting.  Follow-up Follow-up based on lab results and GYN evaluation. - Determine follow-up based on lab results and GYN evaluation.   Preventive Care 48-71 Years Old, Female Preventive care refers to lifestyle choices and visits with your health care provider that can promote health and wellness. Preventive care visits are also called wellness exams. What can I expect for my preventive care visit? Counseling Your health care provider may ask you questions about your: Medical history, including: Past medical problems. Family medical  history. Pregnancy history. Current health, including: Menstrual cycle. Method of birth control. Emotional well-being. Home life and relationship well-being. Sexual activity and sexual health. Lifestyle, including: Alcohol, nicotine or tobacco, and drug use. Access to firearms. Diet, exercise, and sleep habits. Work and work Astronomer. Sunscreen use. Safety issues such as seatbelt and bike helmet use. Physical exam Your health care provider will check your: Height and weight. These may be used to calculate your BMI (body mass index). BMI is a measurement that tells if you are at a healthy weight. Waist circumference. This measures the distance around your waistline. This measurement also tells if you are at a healthy weight and may help predict your risk of certain diseases, such as type 2 diabetes and high blood pressure. Heart rate and blood pressure. Body temperature. Skin for abnormal spots. What immunizations do I need?  Vaccines are usually given at various ages, according to a schedule. Your health care provider will recommend vaccines for you based on your age, medical history, and lifestyle or other factors, such as travel or where you work. What tests do I need? Screening Your health care provider may recommend screening tests for certain conditions. This may include: Lipid and cholesterol levels. Diabetes screening. This is done by checking your blood sugar (glucose) after you have not eaten for a while (fasting). Pelvic exam and Pap test. Hepatitis B test. Hepatitis C test. HIV (human immunodeficiency virus) test. STI (sexually transmitted infection) testing, if you are at risk. Lung cancer screening. Colorectal cancer screening. Mammogram. Talk with your health care provider about when you should start having regular mammograms. This may depend on whether you have a family history of breast cancer. BRCA-related cancer screening. This may be done if  you have a  family history of breast, ovarian, tubal, or peritoneal cancers. Bone density scan. This is done to screen for osteoporosis. Talk with your health care provider about your test results, treatment options, and if necessary, the need for more tests. Follow these instructions at home: Eating and drinking  Eat a diet that includes fresh fruits and vegetables, whole grains, lean protein, and low-fat dairy products. Take vitamin and mineral supplements as recommended by your health care provider. Do not drink alcohol if: Your health care provider tells you not to drink. You are pregnant, may be pregnant, or are planning to become pregnant. If you drink alcohol: Limit how much you have to 0-1 drink a day. Know how much alcohol is in your drink. In the [place] one drink equals one 12 oz bottle of beer (355 mL), one 5 oz glass of wine (148 mL), or one 1 oz glass of hard liquor (44 mL). Lifestyle Brush your teeth every morning and night with fluoride toothpaste. Floss one time each day. Exercise for at least 30 minutes 5 or more days each week. Do not use any products that contain nicotine or tobacco. These products include cigarettes, chewing tobacco, and vaping devices, such as e-cigarettes. If you need help quitting, ask your health care provider. Do not use drugs. If you are sexually active, practice safe sex. Use a condom or other form of protection to prevent STIs. If you do not wish to become pregnant, use a form of birth control. If you plan to become pregnant, see your health care provider for a prepregnancy visit. Take aspirin only as told by your health care provider. Make sure that you understand how much to take and what form to take. Work with your health care provider to find out whether it is safe and beneficial for you to take aspirin daily. Find healthy ways to manage stress, such as: Meditation, yoga, or listening to music. Journaling. Talking to a trusted person. Spending time with  friends and family. Minimize exposure to UV radiation to reduce your risk of skin cancer. Safety Always wear your seat belt while driving or riding in a vehicle. Do not drive: If you have been drinking alcohol. Do not ride with someone who has been drinking. When you are tired or distracted. While texting. If you have been using any mind-altering substances or drugs. Wear a helmet and other protective equipment during sports activities. If you have firearms in your house, make sure you follow all gun safety procedures. Seek help if you have been physically or sexually abused. What's next? Visit your health care provider once a year for an annual wellness visit. Ask your health care provider how often you should have your eyes and teeth checked. Stay up to date on all vaccines. This information is not intended to replace advice given to you by your health care provider. Make sure you discuss any questions you have with your health care provider. Document Revised: 08/08/2020 Document Reviewed: 08/08/2020 Elsevier Patient Education  2024 [employer].
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
## 2023-06-03 NOTE — Progress Notes (Deleted)
 Patient is asking for STD labs including HIV.   Asking for hormone levels to be checked.

## 2023-06-04 ENCOUNTER — Encounter: Payer: Self-pay | Admitting: Medical

## 2023-06-04 LAB — COMPREHENSIVE METABOLIC PANEL WITH GFR
ALT: 12 U/L (ref 0–35)
AST: 13 U/L (ref 0–37)
Albumin: 4.5 g/dL (ref 3.5–5.2)
Alkaline Phosphatase: 60 U/L (ref 39–117)
BUN: 12 mg/dL (ref 6–23)
CO2: 27 meq/L (ref 19–32)
Calcium: 9.5 mg/dL (ref 8.4–10.5)
Chloride: 103 meq/L (ref 96–112)
Creatinine, Ser: 0.75 mg/dL (ref 0.40–1.20)
GFR: 94.89 mL/min (ref >60.00)
Glucose, Bld: 84 mg/dL (ref 70–99)
Potassium: 4 meq/L (ref 3.5–5.1)
Sodium: 140 meq/L (ref 135–145)
Total Bilirubin: 0.4 mg/dL (ref 0.2–1.2)
Total Protein: 7.3 g/dL (ref 6.0–8.3)

## 2023-06-04 LAB — CBC WITH DIFFERENTIAL/PLATELET
Basophils Absolute: 0.1 10*3/uL (ref 0.0–0.1)
Basophils Relative: 1 % (ref 0.0–3.0)
Eosinophils Absolute: 0.2 10*3/uL (ref 0.0–0.7)
Eosinophils Relative: 2.6 % (ref 0.0–5.0)
HCT: 39.8 % (ref 36.0–46.0)
Hemoglobin: 13 g/dL (ref 12.0–15.0)
Lymphocytes Relative: 28.1 % (ref 12.0–46.0)
Lymphs Abs: 2.4 10*3/uL (ref 0.7–4.0)
MCHC: 32.6 g/dL (ref 30.0–36.0)
MCV: 85.4 fl (ref 78.0–100.0)
Monocytes Absolute: 0.4 10*3/uL (ref 0.1–1.0)
Monocytes Relative: 4.1 % (ref 3.0–12.0)
Neutro Abs: 5.5 10*3/uL (ref 1.4–7.7)
Neutrophils Relative %: 64.2 % (ref 43.0–77.0)
Platelets: 257 10*3/uL (ref 150.0–400.0)
RBC: 4.66 Mil/uL (ref 3.87–5.11)
RDW: 15.7 % — ABNORMAL HIGH (ref 11.5–15.5)
WBC: 8.5 10*3/uL (ref 4.0–10.5)

## 2023-06-04 LAB — LIPID PANEL
Cholesterol: 171 mg/dL (ref 0–200)
HDL: 45.4 mg/dL (ref >39.00)
LDL Cholesterol: 97 mg/dL (ref 0–99)
NonHDL: 125.1
Total CHOL/HDL Ratio: 4
Triglycerides: 140 mg/dL (ref 0.0–149.0)
VLDL: 28 mg/dL (ref 0.0–40.0)

## 2023-06-04 LAB — VITAMIN B12: Vitamin B-12: 145 pg/mL — ABNORMAL LOW (ref 211–911)

## 2023-06-04 LAB — TSH: TSH: 1.1 u[IU]/mL (ref 0.35–5.50)

## 2023-06-04 LAB — T4, FREE: Free T4: 0.9 ng/dL (ref 0.60–1.60)

## 2023-06-08 LAB — HIV ANTIBODY (ROUTINE TESTING W REFLEX): HIV 1&2 Ab, 4th Generation: NONREACTIVE

## 2023-06-08 LAB — RPR: RPR Ser Ql: NONREACTIVE

## 2023-06-08 LAB — VITAMIN B1: Vitamin B1 (Thiamine): 6 nmol/L — ABNORMAL LOW (ref 8–30)

## 2023-06-09 ENCOUNTER — Encounter: Payer: Self-pay | Admitting: Medical

## 2023-06-09 LAB — CERVICOVAGINAL ANCILLARY ONLY
Bacterial Vaginitis (gardnerella): POSITIVE — AB
Candida Glabrata: NEGATIVE
Candida Vaginitis: NEGATIVE
Chlamydia: NEGATIVE
Comment: NEGATIVE
Comment: NEGATIVE
Comment: NEGATIVE
Comment: NEGATIVE
Comment: NEGATIVE
Comment: NORMAL
Neisseria Gonorrhea: NEGATIVE
Trichomonas: POSITIVE — AB

## 2023-06-10 MED ORDER — METRONIDAZOLE 500 MG PO TABS: 500.0000 mg | ORAL_TABLET | Freq: Three times a day (TID) | ORAL | 0 refills | Status: AC

## 2023-06-10 NOTE — Addendum Note (Signed)
 Addended by: Serafina Damme on: 06/10/2023 05:27 AM   Modules accepted: Orders

## 2023-06-11 ENCOUNTER — Telehealth (HOSPITAL_BASED_OUTPATIENT_CLINIC_OR_DEPARTMENT_OTHER): Payer: Self-pay

## 2023-06-19 ENCOUNTER — Telehealth (HOSPITAL_BASED_OUTPATIENT_CLINIC_OR_DEPARTMENT_OTHER): Payer: Self-pay

## 2023-06-29 ENCOUNTER — Ambulatory Visit (INDEPENDENT_AMBULATORY_CARE_PROVIDER_SITE_OTHER): Payer: Self-pay | Admitting: Medical

## 2023-06-29 ENCOUNTER — Other Ambulatory Visit (HOSPITAL_COMMUNITY)
Admission: RE | Admit: 2023-06-29 | Discharge: 2023-06-29 | Disposition: A | Discharge status: Home / Self Care | Payer: Self-pay | Source: Ambulatory Visit | Attending: Medical | Admitting: Medical

## 2023-06-29 VITALS — BP 130/74 | HR 74 | Temp 98.0°F | Resp 18 | Ht 61.5 in | Wt 230.0 lb

## 2023-06-29 DIAGNOSIS — Z8619 Personal history of other infectious and parasitic diseases: Secondary | ICD-10-CM

## 2023-06-29 DIAGNOSIS — Z113 Encounter for screening for infections with a predominantly sexual mode of transmission: Secondary | ICD-10-CM

## 2023-06-29 DIAGNOSIS — N898 Other specified noninflammatory disorders of vagina: Secondary | ICD-10-CM | POA: Insufficient documentation

## 2023-06-29 NOTE — Progress Notes (Signed)
 Subjective:    Patient ID: Rhonda Jackson, female    DOB: 27-Jul-1975, 48 y.o.   MRN: [id number]  HPI Pt in for follow up for + trichomonas and BV.  Rhonda Treese "[name]" is a 48 year old female who presents for follow-up on positive results for trichomonas and bacterial vaginosis. Rx of flagyl  sent in and pt reports compliance with treatment.  She seeks confirmation that the infections have cleared as she plans to travel and engage in sexual activity. She completed a course of Flagyl , which can treat both trichomonas and bacterial vaginosis.  She feels she might have a yeast infection following her menstrual period, describing slight soreness but no significant itching or white discharge. The discharge is slightly tinged, which she attributes to the recent end of her menstrual cycle, which concluded last Thursday.  There has been no sexual contact since her last negative tests for gonorrhea and chlamydia, and she requests to exclude these from the current testing. She is concerned about the possibility of reinfection and wants to ensure she is clear before engaging in sexual activity with a new partner.  Her cousin suggested that the yeast infection might be due to the antibiotic use.    Review of Systems  Constitutional:  Negative for chills, fatigue and fever.  Respiratory:  Negative for cough, chest tightness, shortness of breath and wheezing.   Cardiovascular:  Negative for chest pain and palpitations.  Gastrointestinal:  Negative for abdominal pain, nausea and vomiting.  Genitourinary:  Positive for vaginal discharge. Negative for decreased urine volume, dyspareunia, enuresis, genital sores, pelvic pain and vaginal pain.       Maybe associated with menses.  Musculoskeletal:  Negative for back pain and neck pain.  Skin:  Negative for rash.  Neurological:  Negative for dizziness, speech difficulty, weakness and light-headedness.  Hematological:  Negative for adenopathy. Does  not bruise/bleed easily.  Psychiatric/Behavioral:  Negative for behavioral problems, decreased concentration and dysphoric mood.      Past Medical History:  Diagnosis Date   Abnormal Pap smear    Abortion    AMA (advanced maternal age) multigravida 35+    History of chlamydia    Obese    Tuberculosis    hx +PPD   Vaginal Pap smear, abnormal    colpo     Social History   Socioeconomic History   Marital status: Single    Spouse name: Not on file   Number of children: Not on file   Years of education: Not on file   Highest education level: Associate degree: academic program  Occupational History   Not on file  Tobacco Use   Smoking status: Former    Types: Cigarettes   Smokeless tobacco: Never   Tobacco comments:    socially  Vaping Use   Vaping status: Never Used  Substance and Sexual Activity   Alcohol use: Not Currently   Drug use: No   Sexual activity: Not Currently  Other Topics Concern   Not on file  Social History Narrative   Not on file   Social Drivers of Health   Financial Resource Strain: Low Risk  (06/29/2023)   Overall Financial Resource Strain (CARDIA)    Difficulty of Paying Living Expenses: Not very hard  Food Insecurity: No Food Insecurity (06/29/2023)   Hunger Vital Sign    Worried About Running Out of Food in the Last Year: Never true    Ran Out of Food in the Last Year: Never true  Transportation  Needs: No Transportation Needs (06/29/2023)   PRAPARE - Administrator, Civil Service (Medical): No    Lack of Transportation (Non-Medical): No  Physical Activity: Insufficiently Active (06/29/2023)   Exercise Vital Sign    Days of Exercise per Week: 3 days    Minutes of Exercise per Session: 30 min  Stress: Stress Concern Present (06/29/2023)   [employer] of Occupational Health - Occupational Stress Questionnaire    Feeling of Stress : To some extent  Social Connections: Socially Isolated (06/29/2023)   Social Connection and Isolation  Panel [NHANES]    Frequency of Communication with Friends and Family: More than three times a week    Frequency of Social Gatherings with Friends and Family: More than three times a week    Attends Religious Services: Never    Database administrator or Organizations: No    Attends Banker Meetings: Never    Marital Status: Separated  Intimate Partner Violence: Not At Risk (06/03/2023)   Humiliation, Afraid, Rape, and Kick questionnaire    Fear of Current or Ex-Partner: No    Emotionally Abused: No    Physically Abused: No    Sexually Abused: No    Past Surgical History:  Procedure Laterality Date   CESAREAN SECTION  01/28/2011   Procedure: CESAREAN SECTION;  Surgeon: Rik Chasten, MD;  Location: [facility];  Service: Gynecology;  Laterality: N/A;   COLPOSCOPY     DILATION AND CURETTAGE OF UTERUS     tab x2   WISDOM TOOTH EXTRACTION      Family History  Problem Relation Age of Onset   Gout Father    Sleep apnea Father    Emphysema Father    Cancer Mother        breast cancer   Osteoporosis Mother    Anesthesia problems Neg Hx    Hypotension Neg Hx    Malignant hyperthermia Neg Hx    Pseudochol deficiency Neg Hx     Allergies  Allergen Reactions   Gluten Meal Other (See Comments)    Joints swell up   Naproxen Other (See Comments)    "bad stomach ache"    Current Outpatient Medications on File Prior to Visit  Medication Sig Dispense Refill   busPIRone  (BUSPAR ) 7.5 MG tablet Take 1 tablet (7.5 mg total) by mouth 2 (two) times daily. (Patient not taking: Reported on 06/03/2023) 60 tablet 0   venlafaxine  XR (EFFEXOR  XR) 37.5 MG 24 hr capsule Take 1 capsule (37.5 mg total) by mouth daily with breakfast. (Patient not taking: Reported on 06/03/2023) 30 capsule 0   No current facility-administered medications on file prior to visit.    BP 130/74   Pulse 74   Temp 98 F (36.7 C)   Resp 18   Ht 5' 1.5" (1.562 m)   Wt 230 lb (104.3 kg)   LMP 06/21/2023   SpO2  99%   BMI 42.75 kg/m        Objective:   Physical Exam  General- No acute distress. Pleasant patient. Neck- Full range of motion, no jvd Lungs- Clear, even and unlabored. Heart- regular rate and rhythm. Neurologic- CNII- XII grossly intact.  Back- no cva tenderness. Abdomen- soft, nt, nd, +bs, no rebound or guarding.        Assessment & Plan:   Patient Instructions  Bacterial vaginosis and trich recently +. Took flagyl  tx. Requested confirmation of clearance before new sexual activity.(pt self swabbed) - Order cervical vaginal  culture for bacterial vaginosis and trichomoniasis. -g and c negative on last test. No recent contact. Pt declined repeat g and c test.  Yeast infection(concern per pt Possible yeast infection post-metronidazole  treatment. Symptoms include soreness and discharge, possibly related to menstrual cycle. - Order cervical vaginal culture for yeast infection.  Follow up date to be determined after cervicovaginal results back   Sylvia Everts, PA-C

## 2023-06-29 NOTE — Patient Instructions (Signed)
 Bacterial vaginosis and trich recently +. Took flagyl  tx. Requested confirmation of clearance before new sexual activity.(pt self swabbed) - Order cervical vaginal culture for bacterial vaginosis and trichomoniasis. -g and c negative on last test. No recent contact. Pt declined repeat g and c test.  Yeast infection(concern per pt Possible yeast infection post-metronidazole  treatment. Symptoms include soreness and discharge, possibly related to menstrual cycle. - Order cervical vaginal culture for yeast infection.  Follow up date to be determined after cervicovaginal results back

## 2023-07-01 LAB — CERVICOVAGINAL ANCILLARY ONLY
Bacterial Vaginitis (gardnerella): POSITIVE — AB
Candida Glabrata: NEGATIVE
Candida Vaginitis: NEGATIVE
Comment: NEGATIVE
Comment: NEGATIVE
Comment: NEGATIVE
Comment: NEGATIVE
Trichomonas: NEGATIVE

## 2023-07-02 ENCOUNTER — Encounter: Payer: Self-pay | Admitting: Medical

## 2023-07-02 MED ORDER — CLINDAMYCIN HCL 150 MG PO CAPS: 150.0000 mg | ORAL_CAPSULE | Freq: Three times a day (TID) | ORAL | 0 refills | Status: AC

## 2023-07-02 NOTE — Addendum Note (Signed)
 Addended by: Serafina Damme on: 07/02/2023 06:50 AM   Modules accepted: Orders

## 2023-07-28 ENCOUNTER — Inpatient Hospital Stay (HOSPITAL_BASED_OUTPATIENT_CLINIC_OR_DEPARTMENT_OTHER): Admission: RE | Admit: 2023-07-28 | Payer: Self-pay | Source: Ambulatory Visit

## 2023-12-04 ENCOUNTER — Encounter: Payer: Self-pay | Admitting: Obstetrics & Gynecology

## 2024-01-11 ENCOUNTER — Encounter: Payer: Self-pay | Admitting: Advanced Practice Midwife

## 2024-02-01 ENCOUNTER — Encounter: Payer: Self-pay | Admitting: Obstetrics and Gynecology
